# Patient Record
Sex: Female | Born: 1965 | Race: White | Hispanic: No | Marital: Married | Smoking: Former smoker
Health system: Southern US, Community
[De-identification: ages and names within clinical notes are randomized; demographics above are authoritative.]

## PROBLEM LIST (undated history)

## (undated) DIAGNOSIS — I10 Essential (primary) hypertension: Secondary | ICD-10-CM

## (undated) DIAGNOSIS — C023 Malignant neoplasm of anterior two-thirds of tongue, part unspecified: Principal | ICD-10-CM

## (undated) DIAGNOSIS — G35 Multiple sclerosis: Secondary | ICD-10-CM

## (undated) DIAGNOSIS — J302 Other seasonal allergic rhinitis: Secondary | ICD-10-CM

## (undated) DIAGNOSIS — Z923 Personal history of irradiation: Secondary | ICD-10-CM

## (undated) HISTORY — PX: ABDOMINAL HYSTERECTOMY: SHX81

## (undated) HISTORY — PX: WISDOM TOOTH EXTRACTION: SHX21

## (undated) HISTORY — PX: TUBAL LIGATION: SHX77

## (undated) HISTORY — PX: KNEE SURGERY: SHX244

## (undated) HISTORY — DX: Malignant neoplasm of anterior two-thirds of tongue, part unspecified: C02.3

---

## 2015-10-28 HISTORY — PX: PARTIAL GLOSSECTOMY: SHX2173

## 2016-12-18 ENCOUNTER — Encounter: Payer: Self-pay | Admitting: Radiation Oncology

## 2016-12-19 ENCOUNTER — Telehealth: Payer: Self-pay | Admitting: *Deleted

## 2016-12-19 NOTE — Telephone Encounter (Signed)
Oncology Nurse Navigator Documentation  Placed introductory call to new referral patient Sabrina Romero.  Introduced myself as the H&N oncology nurse navigator that works with Dr. Isidore Moos to whom she has self-referred and with whom she has 2/6 0930/1000.  She has received dx of tongue/LN carcinoma from ENT Breck Coons and Radiation Oncologist Lilli Few has prescribed RT.   She has postponed RT start bco move to [place].   She and her husband are moving from [place] to [facility] Wednesday 1/31, expecting to initially stay in Extended Stay hotel.  They have arranged to meet with a realtor to look for house.  I suggested she have realtor show her location of [facility] for reference.  I  provided my contact information, encouraged her to call me after she gets to [place] so I can provide further information/guidance re her 2/6 appt.    She verbalized understanding of information provided, expressed appreciation for my call.  [name], RN, BSN, [place] [facility]ck Oncology Nurse [place] at [place] [phone]

## 2016-12-21 ENCOUNTER — Telehealth: Payer: Self-pay | Admitting: *Deleted

## 2016-12-21 NOTE — Telephone Encounter (Signed)
Oncology Nurse Navigator Documentation  Spoke with Sabrina Romero:  Informed her appt with Dr. Isidore Moos has been move to 9:00 on 2/6 with nurse eval at 8:30.  She indicated wisdom teeth have been extracted, overall dental health is good.  I explained the purpose of a dental evaluation by Dr. Enrique Sack prior to starting RT, indicated she wd be contacted by his office to arrange an appt within a day or so of her appt with Dr. Isidore Moos.  She voiced understanding of information provided.  [name], RN, BSN, [place] Neck Oncology Nurse [facility] at [place] [phone]

## 2016-12-23 ENCOUNTER — Telehealth: Payer: Self-pay | Admitting: Hematology and Oncology

## 2016-12-23 NOTE — Telephone Encounter (Signed)
Lft the pt a vm to let her know that she has an appt w/Dr. Alvy Bimler on the same day as her Radonc appt at 12pm.

## 2016-12-23 NOTE — Progress Notes (Signed)
Head and Neck Cancer Location of Tumor / Histology:  12/17/16 Left Submandibular lymph node biopsy: Positive for metastatic squamous cell carcarcinoma  10/28/2015 Tongue, Left, partial Glossectomy: -- Squamous cell carcinoma   Patient presented on 12/09/16 to her doctor in [place] with symptoms of: Left Ear pain for the past couple of months. She also reported experiencing increased discomfort along the Left tongue  Biopsies of  revealed:  10/28/2015 Tongue, Left, partial Glossectomy: -- Squamous cell carcinoma  12/17/16 Left Submandibular lymph node biopsy: Positive for metastatic squamous cell carcarcinoma    Nutrition Status Yes No Comments  Weight changes? [x]  []  She reports losing 35 lbs over the past year and a half  Swallowing concerns? [x]  []  She is eating softer foods. She also reports being unable to eat spicy food due to pain in her tongue.   PEG? []  [x]     Referrals Yes No Comments  Social Work? []  [x]    Dentistry? [x]  []  Dr. Enrique Sack 12/30/16  Swallowing therapy? []  [x]    Nutrition? []  [x]    Med/Onc? [x]  []  Dr. Alvy Bimler today at 12:00   Safety Issues Yes No Comments  Prior radiation? []  [x]    Pacemaker/ICD? []  [x]    Possible current pregnancy? []  [x]    Is the patient on methotrexate? []  [x]     Tobacco/Marijuana/Snuff/ETOH use: Former smoker, no smokeless tobacco history.   Past/Anticipated interventions by otolaryngology, if any:  She is S/P Left oral tongue partial glossectomy on 10/28/15  Past/Anticipated interventions by medical oncology, if any:  Dr. Alvy Bimler today at 12:00     Current Complaints / other details:    ENT Breck Coons and Radiation Oncologist Lilli Few in [place]. She moved to [place] on 12/23/16.  She was seen in consultation on 12/04/15 by Dr. Lilli Few (Radiation Oncologist). She was advised to pursue Radiation for her Stage  T2 Nx Mx, Left Tongue squamous cell carcinoma which was diagnosed on 09/23/15. She underwent treatment planning  on 12/19/15 but when she presented for her first treatment she elected to not pursue treatment and decided to chose the surveillance route against medical advice.   PET 12/07/16 showed increased activity along the left lateral tongue as well as a new left submandibular node.   BP 139/88   Pulse 86   Temp 98.1 F (36.7 C)   Ht 5\' 4"  (1.626 m)   Wt 184 lb 3.2 oz (83.6 kg)   SpO2 100% Comment: room air  BMI 31.62 kg/m    Wt Readings from Last 3 Encounters:  12/29/16 184 lb 3.2 oz (83.6 kg)

## 2016-12-25 ENCOUNTER — Encounter: Payer: Self-pay | Admitting: Radiation Oncology

## 2016-12-25 NOTE — Progress Notes (Addendum)
Radiation Oncology         [phone] ________________________________  Initial Outpatient Consultation  Name: Sabrina Romero MRN: [id number]  Date: 12/29/2016  DOB: 1966/09/06  CC:Pcp Not In System  No ref. 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 Squamous cell cancer of tongue (HCC) 141.9 C02.9   2. Cancer of anterior two-thirds of tongue (HCC) 141.4 C02.3     CHIEF COMPLAINT: Here to discuss management of Recurrent Oral Tongue cancer  HISTORY OF PRESENT ILLNESS::Sabrina Romero is a 51 y.o. female who is self referred to our clinic following recurrence of tongue cancer.  She was a patient of ENT Dr. Breck Coons in [place], where the patient previously lived. Her cancer was first diagnosed on 09-23-15 and she opted for surgery alone.  Only the tongue tumor was resected - no nodes removed.  Her tumor was 2.5cm, with negative margins by 0.74m, moderately to poorly differentiated squamous cell carcinoma, with +PNI and +LVSI.  Partial glossectomy was performed on 10-28-15.   At the time, she met with Oncologist Dr. KLilli Fewwho recommended radiation therapy to treat the patient's disease.  This was ultimately not performed due to refusal by the patient.   She reports that she underwent PET imaging q 90 days for surveillance.  In Oct 2017 there was concern on MRI and PET of possible tongue recurrence but ENT did not see clinical evidence to warrant a biopsy.  Then[place] revealed a left submandibular node  that was metabolically active. Biopsy of this revealed metastatic squamous cell carcinoma.  Also there was activity in the floor of mouth and the left tongue concerning for active disease.   Patient reports progressive stinging of the left tongue and ear, and tenderness in the left submandibular area.  She has MS - dx in  1985 - which affects ability to urinate (though she doesn't need to self-cath) and relates to LE weakness  R worse than L.  Occasional left temple pain.  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    She's lost 35 pounds since partial glossectomy and it has affected her ability to eat.  She quit smoking "long ago" - denies chewing - reports 1-2 ETOH drinks per year.    She reports recent congestion in her upper airways, allergy-like symptoms.  Also, LE edema related to not having her recliner to sit in.  She is in a hotel until she and her husband (who is here today) [place].  She ambulates with walker for short distances; otherwise uses a wheelchair.   PREVIOUS RADIATION THERAPY: No  PAST MEDICAL HISTORY:  has a past medical history of Hypertension; Multiple sclerosis ([place]; Seasonal allergies; and Squamous cell cancer of tongue ([facility] (10/08/2015).    PAST SURGICAL HISTORY: Past Surgical History:  Procedure Laterality Date  . ABDOMINAL HYSTERECTOMY    . KNEE SURGERY Bilateral   . PARTIAL GLOSSECTOMY Left 10/28/2015   Left oral Tongue partial glossectomy.   . TUBAL LIGATION    . WISDOM TOOTH EXTRACTION      FAMILY HISTORY: family history includes Diabetes in her father; Heart disease in her mother.  SOCIAL HISTORY:  reports that she quit smoking about 20 years ago. She has never used smokeless tobacco. She reports that she drinks alcohol. She  reports that she does not use drugs. The patient and her husband have recently moved to [place] from [place] and are in the process of buying a house. They are currently staying in an Extended Stay hotel.  ALLERGIES: Sulfa antibiotics  MEDICATIONS:  Current Outpatient Prescriptions  Medication Sig Dispense Refill  . baclofen (LIORESAL) 10 MG tablet TAKE UP TO 4 TABLETS DAILY, IN DIVIDED DOSES.  99  . Cholecalciferol (VITAMIN D3) 2000 units capsule Take by mouth    . Glatiramer Acetate (COPAXONE) 40 MG/ML SOSY     .  hydrocodone-ibuprofen (VICOPROFEN) 5-200 MG tablet Take 1 tablet by mouth every 4 (four) hours as needed for pain.    Marland Kitchen lisinopril (PRINIVIL,ZESTRIL) 20 MG tablet Take 20 mg by mouth daily.  0  . Multiple Vitamin (MULTI-VITAMINS) TABS Take by mouth    . Omega-3 Fatty Acids (FISH OIL PO) Take by mouth    . naproxen (NAPROSYN) 500 MG tablet   0  . ondansetron (ZOFRAN) 8 MG tablet Take 1 tablet (8 mg total) by mouth every 8 (eight) hours as needed for nausea. for nausea 60 tablet 0  . oxyCODONE 10 MG TABS Take 1 tablet (10 mg total) by mouth every 6 (six) hours as needed for severe pain. 60 tablet 0   No current facility-administered medications for this encounter.     REVIEW OF SYSTEMS:  A 10+ POINT REVIEW OF SYSTEMS WAS OBTAINED including neurology, dermatology, psychiatry, cardiac, respiratory, lymph, extremities, GI, GU, Musculoskeletal, constitutional, breasts, reproductive, HEENT.  All pertinent positives are noted in the HPI.  All others are negative.   PHYSICAL EXAM:  height is _0  (1.626 m) and weight is 184 lb 3.2 oz (83.6 kg). Her temperature is 98.1 F (36.7 C). Her blood pressure is 139/88 and her pulse is 86. Her oxygen saturation is 100%.   General: Alert and oriented, in no acute distressm in wheelcair HEENT: Head is normocephalic. Extraocular movements are intact. Oropharynx is notable for no lesions. Oral cavity: oral tongue notable along L lateral side for bulging tissue that is possibly tumor recurrence vs surgical effects. No obvious tumor. No thrush. Neck: Neck is notable for palpable level level IIa mass, about 1.5-2cm Heart: Regular in rate and rhythm with no murmurs, rubs, or gallops. Chest: Clear to auscultation bilaterally, with no rhonchi, wheezes, or rales. Abdomen: Soft, nontender, nondistended, with no rigidity or guarding. Extremities: pitting LE edema. Lymphatics: see Neck Exam Skin: No concerning lesions. Musculoskeletal: symmetric strength and muscle tone  throughout UEs; Weakness in LEs, worse on right Neurologic:  Speech is fluent.  Provides a good history.  Alert, oriented. No gross CN deficits Psychiatric: Judgment and insight are intact. Affect is appropriate.  ECOG = 3  0 - Asymptomatic (Fully active, able to carry on all predisease activities without restriction)  1 - Symptomatic but completely ambulatory (Restricted in physically strenuous activity but ambulatory and able to carry out work of a light or sedentary nature. For example, light housework, office work)  2 - Symptomatic, <50% in bed during the day (Ambulatory and capable of all self care but unable to carry out any work activities. Up and about more than 50% of waking hours)  3 - Symptomatic, >50% in bed, but not bedbound (Capable of only limited self-care, confined to bed or chair 50% or more of waking hours)  4 - Bedbound (Completely disabled. Cannot carry on any self-care. Totally confined to bed or chair)  5 - Death   Eustace Pen MM,  Creech RH, Tormey DC, et al. [phone]). "Toxicity and response criteria of the [facility]". [place] Oncol. 5 (6): 649-55  LABORATORY DATA:    :       RADIOGRAPHY: AS ABOVE    IMPRESSION/PLAN:  This is a delightful patient with recurrent head and neck cancer - oral tongue.  She has disease in the nodal tissue and possibly the oral cavity. I would recommend consultation with Dr. Constance Holster of ENT for resection, first; then, 6-7 weeks of adjuvant radiotherapy for this patient, +/- chemotherapy. We discussed goals of care. She wants to achieve cure.  We discussed the potential risks, benefits, and side effects of radiotherapy. We talked in detail about acute and late effects. We discussed that some of the most bothersome acute effects may be mucositis, dysgeusia, salivary changes, skin irritation, hair loss, dehydration, weight loss and fatigue. We talked about late effects which include but are not necessarily limited to  dysphagia, hypothyroidism, nerve injury, spinal cord injury, xerostomia, trismus, and neck edema. No guarantees of treatment were given. A consent form was signed and placed in the patient's medical record. The patient is enthusiastic about proceeding with treatment. I look forward to participating in the patient's care.    CT Simulation (treatment planning) will take place after clearance from dentist and ENT  We also discussed that the treatment of head and neck cancer is a multidisciplinary process to maximize treatment outcomes and quality of life. For this reasons the following referrals have been or will be made:   Medical oncology to discuss chemotherapy. The patient is scheduled to consult with Dr. Alvy Bimler today at noon.   Dentistry for dental evaluation, possible extractions in the radiation fields, and /or advice on reducing risk of cavities, osteoradionecrosis, or other oral issues. The patient is scheduled to consult with Dr. Enrique Sack  prior to beginning radiation therapy to ensure good dental health.   Nutritionist for nutrition support during and after treatment.   Speech language pathology for swallowing and/or speech therapy.   Social work for social support.    Physical therapy due to risk of lymphedema in neck and deconditioning and MS issues.   Baseline labs including TSH. __________________________________________   Eppie Gibson, MD  This document serves as a record of services personally performed by Eppie Gibson, MD. It was created on her behalf by Maryla Morrow, a trained medical scribe. The creation of this record is based on the scribe's personal observations and the provider's statements to them. This document has been checked and approved by the attending provider.

## 2016-12-28 ENCOUNTER — Telehealth: Payer: Self-pay | Admitting: *Deleted

## 2016-12-28 ENCOUNTER — Other Ambulatory Visit: Payer: Self-pay | Admitting: Radiation Oncology

## 2016-12-28 ENCOUNTER — Encounter: Payer: Self-pay | Admitting: Radiation Oncology

## 2016-12-28 ENCOUNTER — Inpatient Hospital Stay
Admission: RE | Admit: 2016-12-28 | Discharge: 2016-12-28 | Disposition: A | Discharge status: Home / Self Care | Payer: Self-pay | Source: Ambulatory Visit | Attending: Radiation Oncology | Admitting: Radiation Oncology

## 2016-12-28 DIAGNOSIS — C801 Malignant (primary) neoplasm, unspecified: Secondary | ICD-10-CM

## 2016-12-28 NOTE — Telephone Encounter (Signed)
Oncology Nurse Navigator Documentation  Spoke with Ms. Noon, confirmed tomorrow's appts with Drs. Isidore Moos and [place], provided directions to [facility], explained arrival and registration procedures.  Also, confirmed understanding of Wed morning's appt with Dr. Enrique Sack. She understands I will be joining her during tomorrow's appts.  [name], RN, BSN, [place] Neck Oncology Nurse [place] at [place] [phone]

## 2016-12-28 NOTE — Telephone Encounter (Signed)
Oncology Nurse Navigator Documentation  Ms. Board called, I again provided appt information for tomorrow and Wednesday.  [name], RN, BSN, [place] Neck Oncology Nurse [place] at [place] [phone]

## 2016-12-29 ENCOUNTER — Encounter: Payer: Self-pay | Admitting: *Deleted

## 2016-12-29 ENCOUNTER — Institutional Professional Consult (permissible substitution): Payer: Medicare Other | Admitting: Radiation Oncology

## 2016-12-29 ENCOUNTER — Encounter: Payer: Self-pay | Admitting: Radiation Oncology

## 2016-12-29 ENCOUNTER — Ambulatory Visit
Admission: RE | Admit: 2016-12-29 | Discharge: 2016-12-29 | Disposition: A | Discharge status: Home / Self Care | Payer: Medicare Other | Source: Ambulatory Visit | Attending: Radiation Oncology | Admitting: Radiation Oncology

## 2016-12-29 ENCOUNTER — Encounter: Payer: Self-pay | Admitting: Hematology and Oncology

## 2016-12-29 ENCOUNTER — Telehealth: Payer: Self-pay | Admitting: *Deleted

## 2016-12-29 ENCOUNTER — Ambulatory Visit (HOSPITAL_BASED_OUTPATIENT_CLINIC_OR_DEPARTMENT_OTHER): Payer: Medicare Other | Admitting: Hematology and Oncology

## 2016-12-29 VITALS — BP 139/88 | HR 86 | Temp 98.1°F | Ht 64.0 in | Wt 184.2 lb

## 2016-12-29 DIAGNOSIS — Z9889 Other specified postprocedural states: Secondary | ICD-10-CM | POA: Diagnosis not present

## 2016-12-29 DIAGNOSIS — C029 Malignant neoplasm of tongue, unspecified: Secondary | ICD-10-CM

## 2016-12-29 DIAGNOSIS — I10 Essential (primary) hypertension: Secondary | ICD-10-CM

## 2016-12-29 DIAGNOSIS — Z888 Allergy status to other drugs, medicaments and biological substances status: Secondary | ICD-10-CM | POA: Diagnosis not present

## 2016-12-29 DIAGNOSIS — Z87891 Personal history of nicotine dependence: Secondary | ICD-10-CM | POA: Insufficient documentation

## 2016-12-29 DIAGNOSIS — G35D Multiple sclerosis, unspecified: Secondary | ICD-10-CM

## 2016-12-29 DIAGNOSIS — Z510 Encounter for antineoplastic radiation therapy: Secondary | ICD-10-CM | POA: Diagnosis not present

## 2016-12-29 DIAGNOSIS — C023 Malignant neoplasm of anterior two-thirds of tongue, part unspecified: Secondary | ICD-10-CM | POA: Insufficient documentation

## 2016-12-29 DIAGNOSIS — Z90710 Acquired absence of both cervix and uterus: Secondary | ICD-10-CM | POA: Diagnosis not present

## 2016-12-29 DIAGNOSIS — G35 Multiple sclerosis: Secondary | ICD-10-CM | POA: Diagnosis not present

## 2016-12-29 DIAGNOSIS — Z79899 Other long term (current) drug therapy: Secondary | ICD-10-CM | POA: Insufficient documentation

## 2016-12-29 DIAGNOSIS — G893 Neoplasm related pain (acute) (chronic): Secondary | ICD-10-CM | POA: Diagnosis not present

## 2016-12-29 DIAGNOSIS — R634 Abnormal weight loss: Secondary | ICD-10-CM

## 2016-12-29 HISTORY — DX: Malignant neoplasm of anterior two-thirds of tongue, part unspecified: C02.3

## 2016-12-29 HISTORY — DX: Essential (primary) hypertension: I10

## 2016-12-29 HISTORY — DX: Other seasonal allergic rhinitis: J30.2

## 2016-12-29 HISTORY — DX: Multiple sclerosis: G35

## 2016-12-29 MED ORDER — OXYCODONE HCL 10 MG PO TABS: 10.0000 mg | ORAL_TABLET | Freq: Four times a day (QID) | ORAL | 0 refills | Status: DC | PRN

## 2016-12-29 MED ORDER — ONDANSETRON HCL 8 MG PO TABS: 8.0000 mg | ORAL_TABLET | Freq: Three times a day (TID) | ORAL | 0 refills | Status: DC | PRN

## 2016-12-29 NOTE — Progress Notes (Signed)
Oncology Nurse Navigator Documentation  1. Met with Sabrina Romero and her husband during initial consult with Dr. Gorsuch.  Showed them the location of Dr. Kulinski's office and [facility] as reference for future appts, including arrival procedure for these appts.   2. She shared:  Has an appt with ENT Dr. Rosen next Tuesday, result of self-referral.  I later informed Dr. Squire.  She has not yet established with a neurologist for her MS, they plan to search out recommendation on MS Society website per guidance from her neurologist in [place]. 3. They understand her case will be discussed at tomorrow morning's H&N Conference. 4. I showed them location of Dental Medicine as reference for tomorrow's appt. I encouraged them to call with questions/concerns, they verbalized understanding.  Rick Diehl, RN, BSN, CHPN Head & Neck Oncology Nurse Navigator [facility] at [place] [phone]  

## 2016-12-29 NOTE — Progress Notes (Signed)
Oncology Nurse Navigator Documentation  1. Met with Sabrina Romero during initial consult with Dr. Isidore Moos.  She was accompanied by her husband.  She arrived in own motorized WC r/t MS.  She noted they have moved to [employer] job transfer, they are closing on new home on 3/16. 2. Further introduced myself as their Navigator, explained my role as a member of the Care Team.   3. Provided New Patient Information packet, discussed contents:  Contact information for physician(s), myself, other members of the Care Team.  Advance Directive information ([place] blue pamphlet with LCSW contact info)  Fall Prevention Patient [place] sheet  [place] campus map with highlight of [place] 4. Provided introductory explanation of radiation treatment including SIM planning and purpose of Aquaplast head and shoulder mask, showed them example.  She voiced familiarity as she had received pre-radiation education in [place], [place]. 5. Provided a tour of SIM and [place] areas, explained treatment and arrival procedures. 6. I escorted them to lobby to register for appt with Dr. Alvy Bimler.    [name], RN, BSN, [place] Neck Oncology Nurse [place] at [place] [phone]

## 2016-12-29 NOTE — Telephone Encounter (Signed)
Oncology Nurse Navigator Documentation  Spoke with Sabrina Romero, asked her to call her radiologist ([place]) and request next-day delivery of discs with recent PET and CT scans.  She later called to confirm request made, they were going to call for shipping information.  I later confirmed with RadOnc Suanne Marker she received call from [facility], discs being shipped [employer] for receipt tomorrow.   [name], RN, BSN, [place] Neck Oncology Nurse [place] at [place] [phone]

## 2016-12-30 ENCOUNTER — Encounter (HOSPITAL_COMMUNITY): Payer: Self-pay | Admitting: Dentistry

## 2016-12-30 ENCOUNTER — Ambulatory Visit (HOSPITAL_COMMUNITY): Payer: Self-pay | Admitting: Dentistry

## 2016-12-30 ENCOUNTER — Telehealth: Payer: Self-pay | Admitting: *Deleted

## 2016-12-30 VITALS — BP 126/78 | HR 89 | Temp 98.3°F

## 2016-12-30 DIAGNOSIS — K030 Excessive attrition of teeth: Secondary | ICD-10-CM | POA: Diagnosis not present

## 2016-12-30 DIAGNOSIS — C023 Malignant neoplasm of anterior two-thirds of tongue, part unspecified: Secondary | ICD-10-CM

## 2016-12-30 DIAGNOSIS — K036 Deposits [accretions] on teeth: Secondary | ICD-10-CM

## 2016-12-30 DIAGNOSIS — K08409 Partial loss of teeth, unspecified cause, unspecified class: Secondary | ICD-10-CM

## 2016-12-30 DIAGNOSIS — K0530 Chronic periodontitis, unspecified: Secondary | ICD-10-CM

## 2016-12-30 DIAGNOSIS — K031 Abrasion of teeth: Secondary | ICD-10-CM

## 2016-12-30 DIAGNOSIS — Z01818 Encounter for other preprocedural examination: Secondary | ICD-10-CM

## 2016-12-30 DIAGNOSIS — K06010 Localized gingival recession, unspecified: Secondary | ICD-10-CM

## 2016-12-30 DIAGNOSIS — J392 Other diseases of pharynx: Secondary | ICD-10-CM

## 2016-12-30 MED ORDER — SODIUM FLUORIDE 1.1 % DT GEL: DENTAL | 99 refills | Status: AC

## 2016-12-30 NOTE — Patient Instructions (Signed)

## 2016-12-30 NOTE — Progress Notes (Signed)
DENTAL CONSULTATION  Date of Consultation:  12/30/2016 Patient Name:   Sabrina Romero Date of Birth:   1966-09-29 Medical Record Number: [id number]  VITALS: BP 126/78 (BP Location: Left Arm)   Pulse 89   Temp 98.3 F (36.8 C) (Oral)    CHIEF COMPLAINT: The patient was referred by Dr. Isidore Moos for a dental consultation.   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  Patient underwent a left partial glossectomy on 10/28/2015 but did not have postoperative radiation therapy. Patient subsequently developed left neck mass that was found to be recurrent squamous cell carcinoma. Patient also with possible left lateral tongue and floor of mouth involvement. Patient with anticipated surgical resection followed by postoperative radiation therapy with possible chemotherapy if needed. Patient is now seen as part of a pre-chemoradiation therapy dental protocol examination.  The patient currently denies acute toothaches, swellings, or abscesses. Patient was last seen by a dentist several weeks ago for dental cleaning. This was with her primary dentist in [place].  The patient is usually seen on every 6 month basis. Patient denies having any unmet dental needs. Patient denies having dental phobia.  PROBLEM LIST: Patient Active Problem List   Diagnosis Date Noted  . Cancer of anterior two-thirds of tongue ([place]) 12/29/2016    Priority: High    PMH: Past Medical History:  Diagnosis Date  . Hypertension   . Multiple sclerosis ([place])   . Seasonal allergies   . Squamous cell cancer of tongue ([place]) 10/08/2015    PSH: Past Surgical History:  Procedure Laterality Date  . ABDOMINAL HYSTERECTOMY    . KNEE SURGERY Bilateral   . PARTIAL GLOSSECTOMY Left 10/28/2015   Left oral Tongue partial glossectomy.   . TUBAL LIGATION    . WISDOM TOOTH EXTRACTION      ALLERGIES: Allergies  Allergen  Reactions  . Sulfa Antibiotics Hives    MEDICATIONS: Current Outpatient Prescriptions  Medication Sig Dispense Refill  . baclofen (LIORESAL) 10 MG tablet TAKE UP TO 4 TABLETS DAILY, IN DIVIDED DOSES.  99  . Cholecalciferol (VITAMIN D3) 2000 units capsule Take by mouth    . Glatiramer Acetate (COPAXONE) 40 MG/ML SOSY     . hydrocodone-ibuprofen (VICOPROFEN) 5-200 MG tablet Take 1 tablet by mouth every 4 (four) hours as needed for pain.    Marland Kitchen lisinopril (PRINIVIL,ZESTRIL) 20 MG tablet Take 20 mg by mouth daily.  0  . Multiple Vitamin (MULTI-VITAMINS) TABS Take by mouth    . naproxen (NAPROSYN) 500 MG tablet   0  . Omega-3 Fatty Acids (FISH OIL PO) Take by mouth    . ondansetron (ZOFRAN) 8 MG tablet Take 1 tablet (8 mg total) by mouth every 8 (eight) hours as needed for nausea. for nausea 60 tablet 0  . oxyCODONE 10 MG TABS Take 1 tablet (10 mg total) by mouth every 6 (six) hours as needed for severe pain. 60 tablet 0   No current facility-administered medications for this visit.     LABS: No results found for: WBC, HGB, HCT, MCV, PLT No results found for: NA, K, CL, CO2, GLUCOSE, BUN, CREATININE, CALCIUM, GFRNONAA, GFRAA No results found for: INR, PROTIME No results found for: PTT  SOCIAL HISTORY: Social History   Social History  . Marital status: Married    Spouse name: Romie Minus  . Number of children: 0  . Years of education: N/A   Occupational History  .  unemployed    Social History Main Topics  . Smoking status: Former Smoker    Quit date: 12/29/1996  . Smokeless tobacco: Never Used     Comment: one pack a week for about 4 years.   . Alcohol use Yes     Comment: rare (twice a year)  . Drug use: No  . Sexual activity: Not on file   Other Topics Concern  . Not on file   Social History Narrative  . No narrative on file    FAMILY HISTORY: Family History  Problem Relation Age of Onset  . Diabetes Father   . Heart disease Mother     REVIEW OF SYSTEMS: Reviewed With  the patient as per history of present illness. Psych: Patient denies having dental phobia.  DENTAL HISTORY: CHIEF COMPLAINT: The patient was referred by Dr. Isidore Moos for a dental consultation.   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  Patient underwent a left partial glossectomy on 10/28/2015 but did not have postoperative radiation therapy. Patient subsequently developed left neck mass that was found to be recurrent squamous cell carcinoma. Patient also with possible left lateral tongue and floor of mouth involvement. Patient with anticipated surgical resection followed by postoperative radiation therapy with possible chemotherapy if needed. Patient is now seen as part of a pre-chemoradiation therapy dental protocol examination.  The patient currently denies acute toothaches, swellings, or abscesses. Patient was last seen by a dentist several weeks ago for dental cleaning. This was with her primary dentist in [place].  The patient is usually seen on every 6 month basis. Patient denies having any unmet dental needs. Patient denies having dental phobia.   DENTAL EXAMINATION: GENERAL: Patient is a well-developed, well-nourished female in no acute distress that presents in a motorized wheelchair. HEAD AND NECK: Patient has left neck lymphadenopathy. No obvious right neck lymphadenopathy. She denies acute TMJ symptoms. Patient has a maximum interincisal opening of 40 mm. INTRAORAL EXAM: Patient has normal saliva. There is no evidence of oral abscess formation. The left lateral posterior tongue is consistent with previous partial glossectomy. DENTITION: The patient is missing tooth numbers 1, 16, 17, and 32. There is evidence of maxillary and mandibular interincisal attrition. Multiple flexure lesions are noted. PERIODONTAL: The patient has chronic periodontitis with minimal plaque  accumulations, selective areas of gingival recession, and no significant tooth mobility. Patient has incipient to moderate bone loss noted. DENTAL CARIES/SUBOPTIMAL RESTORATIONS: No obvious dental caries noted. Restorations all appear to be acceptable. ENDODONTIC: Patient currently denies acute pulpitis symptoms. I do not see any evidence of periapical pathology or radiolucency. Patient has had previous root canal therapy associated with tooth #30 with no obvious persistent periapical pathology or symptoms. CROWN AND BRIDGE: The patient has multiple crown and bridge restorations that are acceptable. PROSTHODONTIC: No partial dentures. OCCLUSION: Patient has some lower anterior crowding but a stable occlusion.  RADIOGRAPHIC INTERPRETATION: An orthopantogram was taken and supplemented with 4 bitewings. There are missing tooth numbers 1, 16, 17, and 32. There is incipient to moderate bone loss noted. There is no evidence of periapical pathology or radiolucency. Tooth #30 as had a previous root canal therapy with no obvious persistent periapical patholgy. There is evidence of maxillary and mandibular anterior incisal attrition.  ASSESSMENTS: 1. Squamous cell carcinoma of the left lateral tongue- status post left partial glossectomy.  2. Pre-chemoradiation therapy dental protocol examination 3. Missing tooth numbers  1, 16, 17, and 32. 4. Chronic periodontitis with bone loss 5. Accretions-minimal 6. Selective areas of gingival recession 7. Maxillary and mandibular incisal attrition 8. Multiple flexure lesions are noted 9. Lower anterior crowding but a stable occlusion   PLAN/RECOMMENDATIONS: 1. I discussed the risks, benefits, and complications of various treatment options with the patient in relationship to her medical and dental conditions, anticipated chemoradiation therapy and chemoradiation therapy side effects to include xerostomia, radiation caries, trismus, mucositis, taste changes, gum and  jawbone changes, and risk for infection and osteoradionecrosis. We discussed various treatment options to include no treatment, multiple extraction teeth in the primary field radiation therapy with alveoloplasty, pre-prosthetic surgery as indicated, periodontal therapy, dental restorations, root canal therapy, crown and bridge therapy, implant therapy, and replacement of missing teeth as indicated. We also discussed fabrication of a tongue positioner, scatter protection devices, and fluoride trays. The patient currently wishes to proceed with impressions today for the fabrication of fluoride trays and start of the fabrication of the tongue positioner/radiation cone locator.  The patient will then return for continued fabrication of the tongue positioner prior to anticipated surgery with Dr. Constance Holster. Fluoride trays will be inserted once it is determined if teeth will need to be extracted in conjunction with the surgery with Dr. Constance Holster. A prescription for FluoriSHIELD was sent to [facility] long outpatient pharmacy with refills for one year.  2. Discussion of findings with medical team and coordination of future medical and dental care as needed.  I spent in excess of  120 minutes during the conduct of this consultation and >50% of this time involved direct face-to-face encounter for counseling and/or coordination of the patient's care.    Lenn Cal, DDS

## 2016-12-30 NOTE — Addendum Note (Signed)
Encounter addended by: Eppie Gibson, MD on: 12/30/2016  1:13 PM<BR>    Actions taken: Sign clinical note

## 2016-12-30 NOTE — Telephone Encounter (Signed)
Oncology Nurse Navigator Documentation  Called Sabrina Romero, informed her consensus at H&N Conference this morning for her tmt plan is surgery followed by radiation therapy.   She understands Dr. Constance Holster will discuss further when she sees him next Tuesday morning.  She thanked me for the update.  [name], RN, BSN, [place] Oncology Nurse [place] at [place] [phone]

## 2016-12-31 ENCOUNTER — Ambulatory Visit
Admission: RE | Admit: 2016-12-31 | Discharge: 2016-12-31 | Disposition: A | Discharge status: Home / Self Care | Payer: Medicare Other | Source: Ambulatory Visit | Attending: Radiation Oncology | Admitting: Radiation Oncology

## 2016-12-31 ENCOUNTER — Ambulatory Visit
Admission: RE | Admit: 2016-12-31 | Discharge: 2016-12-31 | Disposition: A | Discharge status: Home / Self Care | Payer: Self-pay | Source: Ambulatory Visit | Attending: Radiation Oncology | Admitting: Radiation Oncology

## 2016-12-31 ENCOUNTER — Other Ambulatory Visit: Payer: Self-pay | Admitting: Radiation Oncology

## 2016-12-31 DIAGNOSIS — C801 Malignant (primary) neoplasm, unspecified: Secondary | ICD-10-CM

## 2016-12-31 DIAGNOSIS — I10 Essential (primary) hypertension: Secondary | ICD-10-CM | POA: Insufficient documentation

## 2016-12-31 DIAGNOSIS — G893 Neoplasm related pain (acute) (chronic): Secondary | ICD-10-CM | POA: Insufficient documentation

## 2016-12-31 DIAGNOSIS — G35 Multiple sclerosis: Secondary | ICD-10-CM | POA: Insufficient documentation

## 2016-12-31 NOTE — Assessment & Plan Note (Signed)
She will continue follow-up with neurologist.

## 2016-12-31 NOTE — Progress Notes (Signed)
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 female is here because of newly diagnosed recurrent tongue cancer. She is accompanied by her husband. She recently relocated to [place] I review over 100 pages of outside records, collaborated the history with the patient and summarized as follows: Oncology History   Outside PET scan showed no evidence of disease. She doesn't some abnormal uptake on the left side of the tongue.     Cancer of anterior two-thirds of tongue ([place])   09/23/2015 Initial Diagnosis    Initial diagnosis in [place]. 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 She had biopsy which show moderately differentiated squamous cell carcinoma, at least 2.5 mm depth.      10/15/2015 Imaging    Outside CT scan showed no evidence of other disease      10/28/2015 Surgery    She had left oral tongue partial glossectomy which revealed 2.5 cm squamous cell carcinoma, highly infiltrated and ulcerated, moderate to poorly differentiated with lymphovascular invasion and perineural invasion. Margins are negative. The patient refused adjuvant treatment and wanted only surveillance follow-up imaging      10/28/2015 Pathology Results    Surgical pathology revealed squamous cell carcinoma, [phone] cm, 1.1 cm thick, ulcerated, evidence of lymphovascular invasion and perineural invasion, moderate to poorly differentiated, margins were close at 0.6 mm posteriorly      12/10/2015 PET scan    Outside PET scan showed no evidence of disease but there are abnormal uptake      03/27/2016 PET scan    Outside PET/CT scan showed diffuse physiologic activity in the oral cavity and oropharynx. No evidence of distant metastatic disease.      07/07/2016 PET scan    Outside  PET/CT scan show possibility of presence of recurrent tumor.      07/16/2016 Imaging    She had outside MRI of the neck which show abnormal enhancement within the left inferior lateral tongue, extending to partially involve the surrounding tissue.      08/21/2016 PET scan    Outside PET/CT scan show activity along the left lateral tongue and adjacent soft tissue      11/30/2016 Miscellaneous    She has symptoms of pathology and discomfort near the TMJ and along the left side of her tongue.      12/07/2016 PET scan    Outside PET/CT scan show focal uptake in the left lower mild/inferior aspect of the left tongue and lymphadenopathy in the anterior portion of the left submandibular gland involving an abnormal lymph node.      12/17/2016 Procedure    She underwent ultrasound-guided left submandibular lymph node biopsy      12/17/2016 Pathology Results    Biopsy confirmed metastatic squamous cell carcinoma      Since her disease relapse, she has been complaining of mild difficulties with chewing. She denies recent weight loss. She has complained of left ear/jaw pain and she was taking some old prescription that was given to her 2 years ago. She is requesting medication refills. She denies nausea. 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 According to the patient, the reason she did not pursue adjuvant treatment was because of spiritual decision.  MEDICAL HISTORY:  Past Medical History:  Diagnosis Date  . Hypertension   .  Multiple sclerosis ([place])   . Seasonal allergies   . Squamous cell cancer of tongue ([place]) 10/08/2015    SURGICAL HISTORY: Past Surgical History:  Procedure Laterality Date  . ABDOMINAL HYSTERECTOMY    . KNEE SURGERY Bilateral   . PARTIAL GLOSSECTOMY Left 10/28/2015   Left oral Tongue partial glossectomy.   . TUBAL LIGATION    . WISDOM TOOTH EXTRACTION      SOCIAL HISTORY: Social History   Social History  . Marital status:  Married    Spouse name: Romie Minus  . Number of children: 0  . Years of education: N/A   Occupational History  . unemployed    Social History Main Topics  . Smoking status: Former Smoker    Quit date: 12/29/1996  . Smokeless tobacco: Never Used     Comment: one pack a week for about 4 years.   . Alcohol use Yes     Comment: rare (twice a year)  . Drug use: No  . Sexual activity: Not on file   Other Topics Concern  . Not on file   Social History Narrative  . No narrative on file    FAMILY HISTORY: Family History  Problem Relation Age of Onset  . Diabetes Father   . Heart disease Mother     ALLERGIES:  is allergic to sulfa antibiotics.  MEDICATIONS:  Current Outpatient Prescriptions  Medication Sig Dispense Refill  . baclofen (LIORESAL) 10 MG tablet TAKE UP TO 4 TABLETS DAILY, IN DIVIDED DOSES.  99  . Cholecalciferol (VITAMIN D3) 2000 units capsule Take by mouth    . hydrocodone-ibuprofen (VICOPROFEN) 5-200 MG tablet Take 1 tablet by mouth every 4 (four) hours as needed for pain.    Marland Kitchen lisinopril (PRINIVIL,ZESTRIL) 20 MG tablet Take 20 mg by mouth daily.  0  . Multiple Vitamin (MULTI-VITAMINS) TABS Take by mouth    . Omega-3 Fatty Acids (FISH OIL PO) Take by mouth    . ondansetron (ZOFRAN) 8 MG tablet Take 1 tablet (8 mg total) by mouth every 8 (eight) hours as needed for nausea. for nausea (Patient not taking: Reported on 12/30/2016) 60 tablet 0  . Glatiramer Acetate (COPAXONE) 40 MG/ML SOSY Inject 40 mg into the skin 3 (three) times a week.     . naproxen (NAPROSYN) 500 MG tablet   0  . oxyCODONE 10 MG TABS Take 1 tablet (10 mg total) by mouth every 6 (six) hours as needed for severe pain. 60 tablet 0  . sodium fluoride (FLUORISHIELD) 1.1 % GEL dental gel Instill one drop of gel per tooth space of fluoride tray. Place over teeth for 5 minutes. Remove. Spit out excess. Repeat nightly. 120 mL prn   No current facility-administered medications for this visit.     REVIEW OF  SYSTEMS:   Constitutional: Denies fevers, chills or abnormal night sweats Eyes: Denies blurriness of vision, double vision or watery eyes Ears, nose, mouth, throat, and face: Denies mucositis or sore throat Respiratory: Denies cough, dyspnea or wheezes Cardiovascular: Denies palpitation, chest discomfort or lower extremity swelling Gastrointestinal:  Denies nausea, heartburn or change in bowel habits Skin: Denies abnormal skin rashes Lymphatics: Denies new lymphadenopathy or easy bruising Behavioral/Psych: Mood is stable, no new changes  All other systems were reviewed with the patient and are negative.  PHYSICAL EXAMINATION: ECOG PERFORMANCE STATUS: 2 - Symptomatic, <50% confined to bed  Vitals:   12/29/16 1101  BP: 133/71  Pulse: 93  Resp: 18  Temp: 98.6 F (37 C)  Filed Weights   12/29/16 1101  Weight: 184 lb 4.8 oz (83.6 kg)    GENERAL:alert, no distress and comfortable. She is examined sitting on the wheelchair SKIN: skin color, texture, turgor are normal, no rashes or significant lesions EYES: normal, conjunctiva are pink and non-injected, sclera clear OROPHARYNX:no exudate, no erythema and lips, buccal mucosa, with evidence of irregularity at the border of the left lateral side of the tongue.  NECK: supple, thyroid normal size, non-tender, without nodularity LYMPH:  She has palpable lymphadenopathy at the submandibular region LUNGS: clear to auscultation and percussion with normal breathing effort HEART: regular rate & rhythm and no murmurs and no lower extremity edema ABDOMEN:abdomen soft, non-tender and normal bowel sounds Musculoskeletal:no cyanosis of digits and no clubbing  PSYCH: alert & oriented x 3 with fluent speech NEURO: no focal motor/sensory deficits  ASSESSMENT & PLAN:  Cancer of anterior two-thirds of tongue ([place]) I reviewed over 100 pages of outside records. Her case was reviewed at the recent ENT tumor board. The patient will be seeing ENT  soon. She could consider local resection followed by postoperative radiation treatment only. I do not see a role for chemotherapy at this point.   Hypertension Continue current medication. She needs to get established with local primary care doctor  Multiple sclerosis [facility]) She will continue follow-up with neurologist.  Cancer associated pain She is complaining of jaw pain at the site of disease. She requested the exact same prescription she is getting from her doctor in [place]. I told her the specific request cannot be accommodated. I gave her prescription pain medicine without ibuprofen but with allow her to take ibuprofen as needed in addition to the narcotic prescription. I clarify with the patient since I will not be following with her long-term, she would need to get her refill from another provider.  All questions were answered. The patient knows to call the clinic with any problems, questions or concerns. I spent 50 minutes counseling the patient face to face. The total time spent in the appointment was 95 minutes and more than 50% was on counseling.     Heath Lark, MD 12/31/16 5:21 PM

## 2016-12-31 NOTE — Assessment & Plan Note (Signed)
I reviewed over 100 pages of outside records. Her case was reviewed at the recent ENT tumor board. The patient will be seeing ENT soon. She could consider local resection followed by postoperative radiation treatment only. I do not see a role for chemotherapy at this point.

## 2016-12-31 NOTE — Assessment & Plan Note (Signed)
Continue current medication. She needs to get established with local primary care doctor

## 2016-12-31 NOTE — Assessment & Plan Note (Signed)
She is complaining of jaw pain at the site of disease. She requested the exact same prescription she is getting from her doctor in [place]. I told her the specific request cannot be accommodated. I gave her prescription pain medicine without ibuprofen but with allow her to take ibuprofen as needed in addition to the narcotic prescription. I clarify with the patient since I will not be following with her long-term, she would need to get her refill from another provider.

## 2017-01-01 ENCOUNTER — Telehealth: Payer: Self-pay | Admitting: *Deleted

## 2017-01-01 ENCOUNTER — Encounter: Payer: Self-pay | Admitting: *Deleted

## 2017-01-01 NOTE — Telephone Encounter (Signed)
Oncology Nurse Navigator Documentation  Emailed to ENT Dr. Constance Holster relevant information from Dr. Ritta Slot evaluation.  LVMM with his Medical Assistant Linus Orn to facilitate his awareness.  [name], RN, BSN, [place] [facility] Oncology Nurse [place] at [place] [phone]

## 2017-01-04 NOTE — Progress Notes (Signed)
A user error has taken place: encounter opened in error, closed for administrative reasons.

## 2017-01-05 ENCOUNTER — Ambulatory Visit (HOSPITAL_COMMUNITY): Payer: Self-pay | Admitting: Dentistry

## 2017-01-05 ENCOUNTER — Encounter (HOSPITAL_COMMUNITY): Payer: Self-pay | Admitting: Dentistry

## 2017-01-05 VITALS — BP 125/69 | HR 82 | Temp 97.6°F

## 2017-01-05 DIAGNOSIS — Z463 Encounter for fitting and adjustment of dental prosthetic device: Secondary | ICD-10-CM

## 2017-01-05 DIAGNOSIS — Z01818 Encounter for other preprocedural examination: Secondary | ICD-10-CM

## 2017-01-05 DIAGNOSIS — C023 Malignant neoplasm of anterior two-thirds of tongue, part unspecified: Secondary | ICD-10-CM

## 2017-01-05 NOTE — Progress Notes (Signed)
01/05/2017  Patient:            Sabrina Romero Date of Birth:  1966/10/11 MRN:                [id number]   BP 125/69 (BP Location: Left Arm)   Pulse 82   Temp 97.6 F (36.4 C) (Oral)   [name] is a 51 year old female that presents for continued fabrication of a radiation cone locator/tongue positioner. The patient has seen Dr. Constance Holster this morning. Patient with anticipated CT scan followed by surgery with Dr. Constance Holster for removal of the left neck mass. No apparent need for further left lateral tongue or floor of mouth surgery by patient report. Patient is aware that if for mouth or left lateral tongue is involved posterior molars may need to be extracted.  Initial portion of the fabrication of a tongue positioner/ radiation cone locator was done in the lab. The patient now presents for the clinical portion of tongue positioner fabrication in the dental chair.  Procedure: The initial tongue positioner fabrication was tried in the mouth. Good approximation of the device to the lower teeth was noted. The appliance was stable. Additional acrylic was added to allow for registration of the occlusion of the upper left and upper right teeth. The registration allowed for separation of teeth while providing an opening of approximately 1 cm from the upper teeth to the lower teeth.  Patient was instructed that this would provide a layer of safety to the upper teeth and palate during the administration of radiation therapy. The appliance was then adjusted and polished. Instruction was provided on the placement and removal of the tongue positioner/radiation cone locator by the patient.. The patient was able to insert and remove the appliance without difficulty.  This appliance will be taken to radiation oncology for future simulation appointment and provision of radiation therapy after there is confirmation of the anticipated surgery for the patient.    Lenn Cal, DDS

## 2017-01-06 ENCOUNTER — Other Ambulatory Visit: Payer: Self-pay | Admitting: Otolaryngology

## 2017-01-06 DIAGNOSIS — Z85810 Personal history of malignant neoplasm of tongue: Secondary | ICD-10-CM

## 2017-01-07 ENCOUNTER — Ambulatory Visit
Admission: RE | Admit: 2017-01-07 | Discharge: 2017-01-07 | Disposition: A | Discharge status: Home / Self Care | Payer: Medicare Other | Source: Ambulatory Visit | Attending: Otolaryngology | Admitting: Otolaryngology

## 2017-01-07 DIAGNOSIS — Z85810 Personal history of malignant neoplasm of tongue: Secondary | ICD-10-CM

## 2017-01-07 MED ORDER — IOPAMIDOL (ISOVUE-300) INJECTION 61%
75.0000 mL | Freq: Once | INTRAVENOUS | Status: AC | PRN
  Administered 2017-01-07: 75 mL via INTRAVENOUS

## 2017-01-19 ENCOUNTER — Ambulatory Visit: Payer: Medicare Other

## 2017-01-19 ENCOUNTER — Ambulatory Visit: Payer: Medicare Other | Admitting: Physical Therapy

## 2017-01-19 ENCOUNTER — Encounter: Payer: Medicare Other | Admitting: Nutrition

## 2017-01-21 ENCOUNTER — Telehealth: Payer: Self-pay | Admitting: *Deleted

## 2017-01-21 NOTE — Telephone Encounter (Signed)
Oncology Nurse Navigator Documentation  Spoke with April Holding, RN, Navigator, [facility], re coordination of dental extractions during patient's future surgery with Dr. Vicie Mutters.  Per Kim:  Dr. Vicie Mutters indicated surgical procedure will be extensive and require significant time in OR, he does not support additional procedures during that time, including dental and PEG placement.  Patient is aware of recommendation.    Surgery scheduled for 02/16/2017.  Suggest reasonable to move forward with dental extractions prior to surgery.  Drs. Kulinski and [employer] informed.  [name], RN, BSN, [place] [facility] Oncology Nurse [place] at [place] [phone]

## 2017-01-29 ENCOUNTER — Telehealth: Payer: Self-pay | Admitting: *Deleted

## 2017-01-29 ENCOUNTER — Encounter: Payer: Self-pay | Admitting: *Deleted

## 2017-01-29 NOTE — Telephone Encounter (Signed)
Oncology Nurse Navigator Documentation  Per patient's request, I issued/emailed letter in support of her sister's availability to provide support during surgical recovery and potential post-RT.  Letter in [place].  [name], RN, BSN, [place] [facility] Oncology Nurse [place] at [place] [phone]

## 2017-02-11 ENCOUNTER — Telehealth: Payer: Self-pay | Admitting: *Deleted

## 2017-02-11 NOTE — Telephone Encounter (Signed)
Oncology Nurse Navigator Documentation  LVMM and sent email to April Holding, Navigator, informing message left for Dr. Desmond Dike to call Dr. Enrique Sack, [facility], to discuss feasibility of dental procedure while Ms. Easterly is inpatient s/p 3/27 surgery with Dr. Vicie Mutters.  [name], RN, BSN, [place] [facility] Oncology Nurse [place] at [place] [phone]

## 2017-02-11 NOTE — Telephone Encounter (Signed)
Oncology Nurse Navigator Documentation  Spoke with Nira Conn, Dr. Dorinda Hill office [phone]).  Noted Dr. Enrique Sack, [facility], requests dental assist for patient s/p 3/27 surgery with Dr. Vicie Mutters, asked that she call Dr. Enrique Sack.  Heather voiced understanding.  Dr. Enrique Sack notified.  [name], RN, BSN, [place] [facility] Oncology Nurse [place] at [place] [phone]

## 2017-03-05 NOTE — Progress Notes (Signed)
Head and Neck Cancer Location of Tumor / Histology:  12/17/16 Left Submandibular lymph node biopsy: Positive for metastatic squamous cell carcarcinoma  10/28/2015 Tongue, Left, partial Glossectomy: -- Squamous cell carcinoma   Patient presented on 12/09/16 to her doctor in [place] with symptoms of: Left Ear pain for the past couple of months. She also reported experiencing increased discomfort along the Left tongue  Biopsies of  revealed:  10/28/2015 Tongue, Left, partial Glossectomy: -- Squamous cell carcinoma  12/17/16 Left Submandibular lymph node biopsy: Positive for metastatic squamous cell carcarcinoma  02/16/17 FINAL PATHOLOGIC DIAGNOSIS MICROSCOPIC EXAMINATION AND DIAGNOSIS  A.LYMPH NODES, LEFT NECK LEVEL 2B, DISSECTION:  Four benign lymph nodes, negative for malignancy (0/4).  B.SOFT TISSUE, CAROTID BIFURCATION, EXCISION:  Involved by invasive squamous cell carcinoma. Perineural invasion identified.  C.TONGUE AND NECK CONTENTS,LEFT, PARTIAL GLOSSECTOMY AND NECK DISSECTION:  Invasive squamous cell carcinoma, moderately-poorly differentiated, 2.5 cm in greatest dimension. Perineural invasion identified. No lymphvascular invasion identified. Base of tongue and lateral margins are positive for invasive carcinoma. One of three lymph nodes positive for metastatic carcinoma (1/3). The metastatic deposit is 0.9 cm. Extranodal extension present. Pathologic staging: [id number]     Nutrition Status Yes No Comments  Weight changes? [x]  []  She has lost weight gradually over the past 6 months. None since the surgery in March.  Swallowing concerns? [x]  []  She is able to swallow liquids only.   PEG? [x]  []  She is instilling 4 cans osmolite daily.    Referrals Yes No Comments  Social Work? []  [x]    Dentistry? [x]  []    Swallowing therapy? [x]  []  [facility]  Nutrition? [x]  []  [facility]  Med/Onc? [x]  []  Dr. Alvy Bimler 12/29/16    Safety Issues Yes No Comments  Prior radiation? []  [x]    Pacemaker/ICD? []  [x]    Possible current pregnancy? []  [x]    Is the patient on methotrexate? []  [x]     Tobacco/Marijuana/Snuff/ETOH use: Former remote smoker, no smokeless tobacco history.   Past/Anticipated interventions by otolaryngology, if any:  She is S/P Left oral tongue partial glossectomy on 10/28/15  02/16/17 On 02/16/17, she underwent L partial glossectomy, selective neck dissection with SCM flap, and tracheotomy (decannulated on 02/22/17) by Dr. Vicie Mutters at [facility].   02/21/17 On 02/21/17, she underwent laproscopic gastrostomy tube placement for permanent enteral feeding access.     Past/Anticipated interventions by medical oncology, if any:  Dr. Alvy Bimler 12/29/16 ASSESSMENT & PLAN:  Cancer of anterior two-thirds of tongue ([place]) I reviewed over 100 pages of outside records. Her case was reviewed at the recent ENT tumor board. The patient will be seeing ENT soon. She could consider local resection followed by postoperative radiation treatment only. I do not see a role for chemotherapy at this point.     Current Complaints / other details:    ENT [facility] and Radiation Oncologist Lilli Few in [place]. She moved to [place] on 12/23/16.  She was seen in consultation on 12/04/15 by Dr. Lilli Few (Radiation Oncologist). She was advised to pursue Radiation for her Stage  T2 Nx Mx, Left Tongue squamous cell carcinoma which was diagnosed on 09/23/15. She underwent treatment planning on 12/19/15 but when she presented for her first treatment she elected to not pursue treatment and decided to chose the surveillance route against medical advice.   PET 12/07/16 showed increased activity along the left lateral tongue as well as a new left submandibular node.   BP 98/61   Pulse 86   Temp 97.9 F (36.6 C)   Ht 5'  4" (1.626 m)   SpO2 100% Comment: room air

## 2017-03-10 ENCOUNTER — Ambulatory Visit
Admission: RE | Admit: 2017-03-10 | Discharge: 2017-03-10 | Disposition: A | Discharge status: Home / Self Care | Payer: Medicare Other | Source: Ambulatory Visit | Attending: Radiation Oncology | Admitting: Radiation Oncology

## 2017-03-10 ENCOUNTER — Encounter: Payer: Self-pay | Admitting: Radiation Oncology

## 2017-03-10 VITALS — BP 98/61 | HR 86 | Temp 97.9°F | Ht 64.0 in

## 2017-03-10 DIAGNOSIS — R5381 Other malaise: Secondary | ICD-10-CM

## 2017-03-10 DIAGNOSIS — Z510 Encounter for antineoplastic radiation therapy: Secondary | ICD-10-CM | POA: Diagnosis not present

## 2017-03-10 DIAGNOSIS — C023 Malignant neoplasm of anterior two-thirds of tongue, part unspecified: Secondary | ICD-10-CM

## 2017-03-10 DIAGNOSIS — Z1329 Encounter for screening for other suspected endocrine disorder: Secondary | ICD-10-CM

## 2017-03-10 NOTE — Progress Notes (Signed)
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 Screening for hypothyroidism V77.0 Z13.29   2. Cancer of anterior two-thirds of tongue (HCC) 141.4 C02.3 Comprehensive metabolic panel     CBC with Differential     CT Soft Tissue Neck W Contrast     CT Chest W Contrast     Ambulatory referral to Nutrition and Diabetic Education     TSH  3. [place] 780.79 R53.81 TSH    CHIEF COMPLAINT: Here to discuss management of Recurrent Oral Tongue cancer  HISTORY OF PRESENT ILLNESS::Sabrina Romero is a 51 y.o. female who was initially seen in consultation on 12/29/16.  I spoke personally with Dr. Vicie Mutters last week about the patient. He reported her disease was very aggressive and he was not able to resect all the gross disease as it reached the carotid bifurcation. Her pathologic stage is rpT4b, pN2a. She had a metastasis in a single ipsilateral node with extracapsular extension. A total of 7 nodes were removed. The primary tumor from the left tongue was moderately/poorly differentiated invasive squamous cell carcinoma, 2.5 cm in greatest dimension. There was PNI but no LVSI. Margins were positive laterally and at the base of tongue. Surgery was on 02/16/17. Despite our recommendations to coordinate care, she refused dental extractions at the time of her tongue/neck surgery. The last time she has undergone imaging of her chest was when she underwent PET scan in [place] on 12/07/16. At that time she did not have evidence of distant metastatic disease.  Today the patient reports to the clinic with her husband to review the role that  radiotherapy may play in the treatment of the patient's disease.  On review of systems, the patient has lost weight gradually over the past 6 months. She denies any further weight loss since surgery in March. She reports she is able to swallow liquids only, and is only swallowing saliva at this time. She has a PEG tube in place and is instilling 4 cans of Osmolite daily.   Of note, the patient reports she is not inclined to undergo recommended dental extractions. She consulted with Dr. Alvy Bimler on 12/29/16, who did not recommend chemotherapy but was open to considering this based on final pathology. Patient has a speech therapist at [facility] who is seeing her next week.   PREVIOUS RADIATION THERAPY: No  PAST MEDICAL HISTORY:  has a past medical history of Hypertension; Multiple sclerosis ([place]); Seasonal allergies; and Squamous cell cancer of tongue ([place]) (10/08/2015).    PAST SURGICAL HISTORY: Past Surgical History:  Procedure Laterality Date  . ABDOMINAL HYSTERECTOMY    . KNEE SURGERY Bilateral   . PARTIAL GLOSSECTOMY Left 10/28/2015   Left oral Tongue partial glossectomy.   . TUBAL LIGATION    . WISDOM TOOTH EXTRACTION      FAMILY HISTORY: family history includes Diabetes in her father; Heart disease in her mother.  SOCIAL HISTORY:  reports that she quit smoking about 20 years ago. She has never used smokeless tobacco. She reports that she drinks alcohol. She reports that she does not use drugs. The patient and her  husband have recently moved to [place] from [place] and have recently purchased and moved into their new home; they were previously staying in an Extended Stay hotel.   ALLERGIES: Sulfa antibiotics  MEDICATIONS:  Current Outpatient Prescriptions  Medication Sig Dispense Refill  . baclofen (LIORESAL) 10 MG tablet TAKE UP TO 4 TABLETS DAILY, IN DIVIDED DOSES.  99  . Cholecalciferol (VITAMIN D3) 2000 units capsule Take by mouth    . Glatiramer Acetate (COPAXONE) 40 MG/ML SOSY  Inject 40 mg into the skin 3 (three) times a week.     Marland Kitchen lisinopril (PRINIVIL,ZESTRIL) 20 MG tablet Take 20 mg by mouth daily.  0  . metoprolol tartrate (LOPRESSOR) 25 MG tablet Take 25 mg by mouth.    . cetirizine (ZYRTEC) 10 MG tablet Take 10 mg by mouth.    . hydrocodone-ibuprofen (VICOPROFEN) 5-200 MG tablet Take 1 tablet by mouth every 4 (four) hours as needed for pain.    . Multiple Vitamin (MULTI-VITAMINS) TABS Take by mouth    . naproxen (NAPROSYN) 500 MG tablet   0  . Omega-3 Fatty Acids (FISH OIL PO) Take by mouth    . ondansetron (ZOFRAN) 8 MG tablet Take 1 tablet (8 mg total) by mouth every 8 (eight) hours as needed for nausea. for nausea (Patient not taking: Reported on 12/30/2016) 60 tablet 0  . oxyCODONE 10 MG TABS Take 1 tablet (10 mg total) by mouth every 6 (six) hours as needed for severe pain. (Patient not taking: Reported on 03/10/2017) 60 tablet 0  . sodium fluoride (FLUORISHIELD) 1.1 % GEL dental gel Instill one drop of gel per tooth space of fluoride tray. Place over teeth for 5 minutes. Remove. Spit out excess. Repeat nightly. (Patient not taking: Reported on 03/10/2017) 120 mL prn   No current facility-administered medications for this encounter.     REVIEW OF SYSTEMS:  A 10+ POINT REVIEW OF SYSTEMS WAS OBTAINED including neurology, dermatology, psychiatry, cardiac, respiratory, lymph, extremities, GI, GU, Musculoskeletal, constitutional, breasts, reproductive, HEENT.  All pertinent positives are noted in the HPI.  All others are negative.   PHYSICAL EXAM:  height is 5\' 4"  (1.626 m). Her temperature is 97.9 F (36.6 C). Her blood pressure is 98/61 and her pulse is 86. Her oxygen saturation is 100%.   General: Alert and oriented, in no acute distress. In wheelchair.  HEENT: In the left floor of mouth bordering on the left anterior tongue there is some foul smelling greyish tissue that appears to be necrotic, post surgical in nature. She is scheduled to see Dr. Vicie Mutters next week  and reports that he may be removing some of that tissue at that time. Mucous membranes are moist. Neck: In the left upper neck she has a healing surgical scar that is somewhat tender to palpation. She has a vertical scar from former tracheostomy. 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 No adenopathy in cervical or supraclavicular neck. Abdomen: Peg tube in place in left upper abdomen with minimal drainage around PEG site. Lymphatics: see Neck Exam Skin: No concerning lesions. Neurologic:  Speech is fluent.  Provides a good history.  Alert, oriented. No gross CN deficits. Psychiatric: Judgment and insight are intact. Affect is appropriate.   ECOG = 3    LABORATORY DATA:    :     [facility] labs:     RADIOGRAPHY: AS ABOVE    IMPRESSION/PLAN:  This is a delightful patient with recurrent head and neck cancer - oral tongue. HIGH  RISK.  She understands less than half of patients achieve long term cure with her type of disease and adjuvant therapy; but, she is still eligible for curative therapy in terms of radiation.  I would recommend  6-7 weeks of adjuvant radiotherapy for this patient, +/- chemotherapy. We again discussed goals of care. She wants to achieve cure.   We again discussed the potential risks, benefits, and side effects of radiotherapy. We talked in detail about acute and late effects. We discussed that some of the most bothersome acute effects may be mucositis, dysgeusia, salivary changes, skin irritation, hair loss, dehydration, weight loss and fatigue. We talked about late effects which include but are not necessarily limited to dysphagia, hypothyroidism, nerve injury, spinal cord injury, xerostomia, trismus, and neck edema. No guarantees of treatment were given. A consent form was previously signed and placed in the patient's medical record. The patient is enthusiastic about proceeding with treatment. I look forward to participating in the patient's care.      CT Simulation (treatment planning) will be scheduled soon. The patient prefers appointments in the morning, ideally before 10 am.  I will order a CT neck and chest for restaging purposes.  Follow up with Dr. Enrique Sack asap to confirm proper fit of dental guard following surgery.  I will present this patient's case at the next tumor board, as well as refer the patient back to Dr. Alvy Bimler for further consideration of chemotherapy in conjunction with radiotherapy.  For now she will defer on SW referral as well as PT referral at this time. The patient will be referred to nutrition to follow closely with the patient during treatment. SLP is following at [facility].  I will CC Dr. Vicie Mutters, Dr. Alvy Bimler, and Dr. Enrique Sack on my findings today.  I spent 35 minutes face to face with the patient and more than 50% of that time was spent in counseling and/or coordination of care. __________________________________________   Eppie Gibson, MD  This document serves as a record of services personally performed by Eppie Gibson, MD. It was created on her behalf by Maryla Morrow, a trained medical scribe. The creation of this record is based on the scribe's personal observations and the provider's statements to them. This document has been checked and approved by the attending provider.

## 2017-03-11 ENCOUNTER — Telehealth: Payer: Self-pay | Admitting: *Deleted

## 2017-03-11 NOTE — Telephone Encounter (Signed)
CALLED PATIENT TO INFORM OF CT ON 03-18-17 AND HER NUTRITION APPT. ON 03-22-17 WITH BARBARA NEFF, LVM FOR A RETURN CALL

## 2017-03-15 ENCOUNTER — Telehealth: Payer: Self-pay | Admitting: *Deleted

## 2017-03-15 ENCOUNTER — Telehealth (HOSPITAL_COMMUNITY): Payer: Self-pay | Admitting: Dentistry

## 2017-03-15 NOTE — Telephone Encounter (Signed)
03/15/17 Pt's husband, Marland Kitchen called and stated appt. for wife on 03/16/17 @ 1:30 w/Dr. Enrique Sack would need to be reshcl. due to work. No appt. schl. at this

## 2017-03-15 NOTE — Telephone Encounter (Signed)
Oncology Nurse Navigator Documentation  Returned Sabrina Romero's call.  We discussed need to reschedule appt with Dental Medicine for fitting of mouth piece prior to 4/30 CT SIM.  She indicated best for Dental Medicine to contact her husband to schedule as he provides transportation.  I confirmed his phone number listed in Demographics.  Note routed to Dr. Enrique Sack.  [name], RN, BSN, [place] [facility] Oncology Nurse [place] at [place] [phone]

## 2017-03-15 NOTE — Telephone Encounter (Signed)
Oncology Nurse Navigator Documentation  Called Ms. Marsan to address appt scheduling.  LVMM, asked her to return my call.  [name], RN, BSN, [place] [facility] Oncology Nurse [place] at [place] [phone]

## 2017-03-16 ENCOUNTER — Telehealth: Payer: Self-pay | Admitting: Hematology and Oncology

## 2017-03-16 ENCOUNTER — Encounter (HOSPITAL_COMMUNITY): Payer: Self-pay | Admitting: Dentistry

## 2017-03-16 NOTE — Telephone Encounter (Signed)
Left message re f/u 4/30.

## 2017-03-18 ENCOUNTER — Ambulatory Visit (HOSPITAL_COMMUNITY)
Admission: RE | Admit: 2017-03-18 | Discharge: 2017-03-18 | Disposition: A | Discharge status: Home / Self Care | Payer: Medicare Other | Source: Ambulatory Visit | Attending: Radiation Oncology | Admitting: Radiation Oncology

## 2017-03-18 ENCOUNTER — Encounter (HOSPITAL_COMMUNITY): Payer: Self-pay | Admitting: Dentistry

## 2017-03-18 ENCOUNTER — Ambulatory Visit (HOSPITAL_COMMUNITY): Payer: Medicaid - Dental | Admitting: Dentistry

## 2017-03-18 ENCOUNTER — Ambulatory Visit
Admission: RE | Admit: 2017-03-18 | Discharge: 2017-03-18 | Disposition: A | Discharge status: Home / Self Care | Payer: Medicare Other | Source: Ambulatory Visit | Attending: Radiation Oncology | Admitting: Radiation Oncology

## 2017-03-18 VITALS — BP 129/66 | HR 92 | Temp 97.8°F

## 2017-03-18 DIAGNOSIS — C023 Malignant neoplasm of anterior two-thirds of tongue, part unspecified: Secondary | ICD-10-CM

## 2017-03-18 DIAGNOSIS — Z01818 Encounter for other preprocedural examination: Secondary | ICD-10-CM

## 2017-03-18 DIAGNOSIS — Z9889 Other specified postprocedural states: Secondary | ICD-10-CM | POA: Insufficient documentation

## 2017-03-18 DIAGNOSIS — R918 Other nonspecific abnormal finding of lung field: Secondary | ICD-10-CM | POA: Diagnosis not present

## 2017-03-18 DIAGNOSIS — R5381 Other malaise: Secondary | ICD-10-CM

## 2017-03-18 DIAGNOSIS — Z463 Encounter for fitting and adjustment of dental prosthetic device: Secondary | ICD-10-CM

## 2017-03-18 LAB — CBC WITH DIFFERENTIAL/PLATELET
BASO%: 0.3 % (ref 0.0–2.0)
BASOS ABS: 0 10*3/uL (ref 0.0–0.1)
EOS ABS: 0.3 10*3/uL (ref 0.0–0.5)
EOS%: 4.6 % (ref 0.0–7.0)
HCT: 35.9 % (ref 34.8–46.6)
HGB: 11.9 g/dL (ref 11.6–15.9)
LYMPH%: 32.9 % (ref 14.0–49.7)
MCH: 31.2 pg (ref 25.1–34.0)
MCHC: 33.1 g/dL (ref 31.5–36.0)
MCV: 94.2 fL (ref 79.5–101.0)
MONO#: 0.5 10*3/uL (ref 0.1–0.9)
MONO%: 8.4 % (ref 0.0–14.0)
NEUT#: 3.4 10*3/uL (ref 1.5–6.5)
NEUT%: 53.8 % (ref 38.4–76.8)
Platelets: 287 10*3/uL (ref 145–400)
RBC: 3.81 10*6/uL (ref 3.70–5.45)
RDW: 13.6 % (ref 11.2–14.5)
WBC: 6.3 10*3/uL (ref 3.9–10.3)
lymph#: 2.1 10*3/uL (ref 0.9–3.3)

## 2017-03-18 LAB — COMPREHENSIVE METABOLIC PANEL
ALT: 25 U/L (ref 0–55)
AST: 16 U/L (ref 5–34)
Albumin: 3.8 g/dL (ref 3.5–5.0)
Alkaline Phosphatase: 133 U/L (ref 40–150)
Anion Gap: 9 mEq/L (ref 3–11)
BILIRUBIN TOTAL: 0.37 mg/dL (ref 0.20–1.20)
BUN: 14.4 mg/dL (ref 7.0–26.0)
CALCIUM: 10.2 mg/dL (ref 8.4–10.4)
CHLORIDE: 101 meq/L (ref 98–109)
CO2: 28 meq/L (ref 22–29)
CREATININE: 0.8 mg/dL (ref 0.6–1.1)
EGFR: 90 mL/min/{1.73_m2} — ABNORMAL LOW (ref >90)
Glucose: 93 mg/dl (ref 70–140)
Potassium: 4.7 mEq/L (ref 3.5–5.1)
Sodium: 137 mEq/L (ref 136–145)
TOTAL PROTEIN: 7.2 g/dL (ref 6.4–8.3)

## 2017-03-18 LAB — TSH: TSH: 1.853 m(IU)/L (ref 0.308–3.960)

## 2017-03-18 MED ORDER — IOPAMIDOL (ISOVUE-300) INJECTION 61%
75.0000 mL | Freq: Once | INTRAVENOUS | Status: AC | PRN
  Administered 2017-03-18: 75 mL via INTRAVENOUS

## 2017-03-18 MED ORDER — IOPAMIDOL (ISOVUE-300) INJECTION 61%
INTRAVENOUS | Status: AC
  Filled 2017-03-18: qty 75

## 2017-03-18 MED ORDER — SODIUM FLUORIDE 1.1 % DT CREA: TOPICAL_CREAM | DENTAL | 99 refills | Status: AC

## 2017-03-18 MED FILL — SF 1.1% GEL: 1.1 | 30 days supply | Qty: 56 | Fill #0

## 2017-03-18 NOTE — Patient Instructions (Signed)

## 2017-03-18 NOTE — Progress Notes (Signed)
Has armband been applied?  yes  Does patient have an allergy to IV contrast dye?: no   Has patient ever received premedication for IV contrast dye?:  yes  Does patient take metformin?: no  If patient does take metformin when was the last dose:  Date of lab work: 03/18/17 BUN: 14.4 CR: .8  IV site: left hand  Has IV site been added to flowsheet?  yes

## 2017-03-18 NOTE — Progress Notes (Signed)
03/18/2017  Patient Name:   Tisha Cline Date of Birth:   [birth year] Medical Record Number: [id number]  BP 129/66 (BP Location: Left Arm)   Pulse 92   Temp 97.8 F (36.6 C) (Oral)   Cyla Haluska now presents for insertion of upper and lower fluoride trays and re-tryin of the tongue positioner. Patient now has decrease in maximum interincisal opening to 22 mm down from 40 mm. Tongue positioner was adjusted to new opening and occlusion of appliance was remade. Patient is able to accomadate the new appliance as remade. Patient is able to get the appliance in and out without problems. Polished appliance.  Fluoride trays were tried in and adjusted as needed.  Postop instructions were provided and a written and verbal format concerning the use and care of appliances. All questions were answered. Patient to return to clinic for periodic oral examination in approximately 2-3weeks during radiation therapy. Rx for Prevident 5000 Plus was sent to [facility] until FluoriSHIELD is restocked from [place] at [facility]. Patient to call if questions or problems arise before then.  Lenn Cal, DDS

## 2017-03-22 ENCOUNTER — Ambulatory Visit
Admission: RE | Admit: 2017-03-22 | Discharge: 2017-03-22 | Disposition: A | Discharge status: Home / Self Care | Payer: Medicare Other | Source: Ambulatory Visit | Attending: Radiation Oncology | Admitting: Radiation Oncology

## 2017-03-22 ENCOUNTER — Ambulatory Visit (HOSPITAL_BASED_OUTPATIENT_CLINIC_OR_DEPARTMENT_OTHER): Payer: Medicare Other | Admitting: Hematology and Oncology

## 2017-03-22 ENCOUNTER — Encounter: Payer: Self-pay | Admitting: Nutrition

## 2017-03-22 VITALS — BP 112/73 | HR 88 | Temp 97.8°F

## 2017-03-22 DIAGNOSIS — C023 Malignant neoplasm of anterior two-thirds of tongue, part unspecified: Secondary | ICD-10-CM

## 2017-03-22 DIAGNOSIS — G893 Neoplasm related pain (acute) (chronic): Secondary | ICD-10-CM

## 2017-03-22 DIAGNOSIS — R918 Other nonspecific abnormal finding of lung field: Secondary | ICD-10-CM

## 2017-03-22 MED ORDER — SODIUM CHLORIDE 0.9% FLUSH: 10.0000 mL | Freq: Once | INTRAVENOUS | Status: DC

## 2017-03-22 MED ORDER — SCOPOLAMINE 1 MG/3DAYS TD PT72: 1.0000 | MEDICATED_PATCH | TRANSDERMAL | 2 refills | Status: AC

## 2017-03-22 MED FILL — TRANSDERM-SCOP 1.5 MG/3 DAY: 1 | 30 days supply | Qty: 10 | Fill #0

## 2017-03-22 NOTE — Progress Notes (Signed)
Note to chart  Outpatient  Diagnosis:    ICD-9-CM ICD-10-CM   1. Cancer of anterior two-thirds of tongue ([place]) 141.4 C02.3    Patient could not tolerate simulation/ supine position today due to copious saliva.  Scopolamine patch Rx'd. Re-attempt later this week.  Discussed CT chest results with pt and husband.  Concern for possible lung mets.  In light of this, Dr Alvy Bimler did not recommend concurrent chemo at tumor board. Patient still wants to proceed with RT alone, and have the benefit of the doubt that disease may still be contained to neck.  We will re-scan lungs after treatment.  She understands risks and benefits and alternatives of a radiotherapy course.    -----------------------------------  Eppie Gibson, MD

## 2017-03-23 ENCOUNTER — Encounter: Payer: Self-pay | Admitting: Hematology and Oncology

## 2017-03-23 NOTE — Assessment & Plan Note (Signed)
She has mild pain after surgery I would defer to her radiation oncologist for pain management

## 2017-03-23 NOTE — Assessment & Plan Note (Signed)
Recent surgical pathology showed evidence of high risk disease and residual disease CT imaging results were discussed with the patient and her husband The new pulmonary nodules could very well be due to new metastatic disease She is not symptomatic The lesions are too small for biopsy I told her the implication of possible diagnosis of metastatic cancer which means that treatment with radiation is not considered of curative intent Given high risk of local recurrence that could cause significant mobilities, ultimately, the patient and her husband agree not to pursue systemic chemotherapy, rather, to focus on radiation treatment to prevent local recurrence I plan to see her back after she completes radiation treatment as I suspect the lung nodules are metastatic in nature and she would very well need systemic treatment very soon

## 2017-03-23 NOTE — Assessment & Plan Note (Signed)
I reviewed imaging study results Both declined to review the imaging study She is not symptomatic I told him the implication of the findings They understood that it could represent metastatic disease That needs to be observed with serial imaging study after she completes radiation treatment

## 2017-03-23 NOTE — Progress Notes (Signed)
[facility] OFFICE PROGRESS NOTE  Patient Care Team: No Pcp Per Patient as PCP - General (General Practice) Philomena Doheny, MD as Referring Physician (Plastic Surgery) Eppie Gibson, MD as Attending Physician (Radiation Oncology) Heath Lark, MD as Consulting Physician (Hematology and Oncology) Leota Sauers, RN as Oncology Nurse [place], RD as Dietitian (Nutrition)  SUMMARY OF ONCOLOGIC HISTORY: Oncology History   Outside PET scan showed no evidence of disease. She doesn't some abnormal uptake on the left side of the tongue.     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 She had biopsy which show moderately differentiated squamous cell carcinoma, at least 2.5 mm depth.      10/15/2015 Imaging    Outside CT scan showed no evidence of other disease      10/28/2015 Surgery    She had left oral tongue partial glossectomy which revealed 2.5 cm squamous cell carcinoma, highly infiltrated and ulcerated, moderate to poorly differentiated with lymphovascular invasion and perineural invasion. Margins are negative. The patient refused adjuvant treatment and wanted only surveillance follow-up imaging      10/28/2015 Pathology Results    Surgical pathology revealed squamous cell carcinoma, [phone] cm, 1.1 cm thick, ulcerated, evidence of lymphovascular invasion and perineural invasion, moderate to poorly differentiated, margins were close at 0.6 mm posteriorly      12/10/2015 PET scan    Outside PET scan showed no evidence of disease but there are abnormal uptake      03/27/2016 PET scan    Outside PET/CT scan showed diffuse physiologic activity in the oral cavity and oropharynx. No evidence of distant metastatic disease.      07/07/2016 PET scan    Outside PET/CT scan show possibility of presence of recurrent tumor.      07/16/2016  Imaging    She had outside MRI of the neck which show abnormal enhancement within the left inferior lateral tongue, extending to partially involve the surrounding tissue.      08/21/2016 PET scan    Outside PET/CT scan show activity along the left lateral tongue and adjacent soft tissue      11/30/2016 Miscellaneous    She has symptoms of pathology and discomfort near the TMJ and along the left side of her tongue.      12/07/2016 PET scan    Outside PET/CT scan show focal uptake in the left lower mild/inferior aspect of the left tongue and lymphadenopathy in the anterior portion of the left submandibular gland involving an abnormal lymph node.      12/17/2016 Procedure    She underwent ultrasound-guided left submandibular lymph node biopsy      12/17/2016 Pathology Results    Biopsy confirmed metastatic squamous cell carcinoma      01/07/2017 Imaging    CT neck: 1. Appearance highly suspicious for recurrent tumor in the substance of the left tongue, size estimated at 28 x 22 x 23 mm and progressed since 07/16/2016. See series 3, image 36, coronal image 32, and sagittal image 54 2. Suspicious left level Ib and left level IIa lymph nodes, possibly with extracapsular extension at the latter. 3. Follow-up PET-CT would be complementary. 4. Architectural distortion anterior and posterior to the left submandibular gland which may be mildly obstructed and seems to correspond to the palpable abnormality      02/16/2017 Surgery  She underwent L partial glossectomy, selective neck dissection with SCM flap, and tracheotomy. The procedure was tolerated without complication. She resumed feeds via dobhoff tube. The trach was downgraded to cuffless, and then decannulated on 02/22/17 without difficulty.         02/16/2017 Pathology Results    A.LYMPH NODES, LEFT NECK LEVEL 2B, DISSECTION:  Four benign lymph nodes, negative for malignancy (0/4).  B.SOFT TISSUE, CAROTID BIFURCATION, EXCISION:   Involved by invasive squamous cell carcinoma. Perineural invasion identified.  C.TONGUE AND NECK CONTENTS,LEFT, PARTIAL GLOSSECTOMY AND NECK DISSECTION:  Invasive squamous cell carcinoma, moderately-poorly differentiated, 2.5 cm in greatest dimension. Perineural invasion identified. No lymphvascular invasion identified. Base of tongue and lateral margins are positive for invasive carcinoma. One of three lymph nodes positive for metastatic carcinoma (1/3). The metastatic deposit is 0.9 cm. Extranodal extension present. Pathologic staging: [id number] See cancer protocol.      02/21/2017 Procedure    She underwent laproscopic gastrostomy tube placement for permanent enteral feeding access.          03/18/2017 Imaging    CT chest: Interval development of tiny bilateral pulmonary nodules. Given the history of head and neck cancer, close follow-up recommended.      03/18/2017 Imaging    CT neck: Post treatment changes status post partial LEFT glossectomy and modified radical neck resection for recurrent squamous cell carcinoma, 1 month ago. The reported residual tumor surrounding the LEFT carotid bifurcation is inseparable from postoperative fluid/scarring. Continued surveillance warranted       INTERVAL HISTORY: Please see below for problem oriented charting. She returns for further follow-up I review outside records extensively and review recent imaging She underwent surgery Surgery was incomplete and residual disease is present Recent CT imaging show possible new metastatic cancer to her lungs She is not symptomatic She is doing well after surgery Pain is under good control She is using her feeding tube for nutritional needs  REVIEW OF SYSTEMS:   Constitutional: Denies fevers, chills or abnormal weight loss Eyes: Denies blurriness of vision Ears, nose, mouth, throat, and face: Denies mucositis or sore throat Respiratory:  Denies cough, dyspnea or wheezes Cardiovascular: Denies palpitation, chest discomfort or lower extremity swelling Gastrointestinal:  Denies nausea, heartburn or change in bowel habits Skin: Denies abnormal skin rashes Lymphatics: Denies new lymphadenopathy or easy bruising Neurological:Denies numbness, tingling or new weaknesses Behavioral/Psych: Mood is stable, no new changes  All other systems were reviewed with the patient and are negative.  I have reviewed the past medical history, past surgical history, social history and family history with the patient and they are unchanged from previous note.  ALLERGIES:  is allergic to sulfa antibiotics.  MEDICATIONS:  Current Outpatient Prescriptions  Medication Sig Dispense Refill  . bacitracin 500 UNIT/GM ointment   0  . baclofen (LIORESAL) 10 MG tablet TAKE UP TO 4 TABLETS DAILY, IN DIVIDED DOSES.  99  . cetirizine (ZYRTEC) 10 MG tablet Take 10 mg by mouth.    . Cholecalciferol (VITAMIN D3) 2000 units capsule Take by mouth    . Glatiramer Acetate (COPAXONE) 40 MG/ML SOSY Inject 40 mg into the skin 3 (three) times a week.     . hydrocodone-ibuprofen (VICOPROFEN) 5-200 MG tablet Take 1 tablet by mouth every 4 (four) hours as needed for pain.    Marland Kitchen lisinopril (PRINIVIL,ZESTRIL) 20 MG tablet Take 20 mg by mouth daily.  0  . metoprolol tartrate (LOPRESSOR) 25 MG tablet Take 25 mg by mouth.    . Multiple Vitamin (  MULTI-VITAMINS) TABS Take by mouth    . naproxen (NAPROSYN) 500 MG tablet   0  . Omega-3 Fatty Acids (FISH OIL PO) Take by mouth    . scopolamine (TRANSDERM-SCOP) 1 MG/3DAYS Place 1 patch (1.5 mg total) onto the skin every 3 (three) days. This may help thick saliva/secretions. 10 patch 2  . sodium fluoride (PREVIDENT 5000 PLUS) 1.1 % CREA dental cream Apply to tooth brush. Brush teeth for 2 minutes. Spit out excess. Repeat nightly. 1 Tube prn  . ondansetron (ZOFRAN) 8 MG tablet Take 1 tablet (8 mg total) by mouth every 8 (eight) hours as  needed for nausea. for nausea (Patient not taking: Reported on 12/30/2016) 60 tablet 0  . oxyCODONE 10 MG TABS Take 1 tablet (10 mg total) by mouth every 6 (six) hours as needed for severe pain. (Patient not taking: Reported on 03/10/2017) 60 tablet 0  . sodium fluoride (FLUORISHIELD) 1.1 % GEL dental gel Instill one drop of gel per tooth space of fluoride tray. Place over teeth for 5 minutes. Remove. Spit out excess. Repeat nightly. (Patient not taking: Reported on 03/10/2017) 120 mL prn   No current facility-administered medications for this visit.     PHYSICAL EXAMINATION: ECOG PERFORMANCE STATUS: 2 - Symptomatic, <50% confined to bed  Vitals:   03/22/17 1245  BP: 107/70  Pulse: 84  Resp: 18  Temp: 98.4 F (36.9 C)   Filed Weights    GENERAL:alert, no distress and comfortable SKIN: skin color, texture, turgor are normal, no rashes or significant lesions EYES: normal, Conjunctiva are pink and non-injected, sclera clear OROPHARYNX: well-healed hemiglossectomy changes in her mouth NECK: Well-healed surgical scar  LYMPH:  no palpable lymphadenopathy in the cervical, axillary or inguinal LUNGS: clear to auscultation and percussion with normal breathing effort HEART: regular rate & rhythm and no murmurs and no lower extremity edema ABDOMEN:abdomen soft, non-tender and normal bowel sounds Musculoskeletal:no cyanosis of digits and no clubbing  NEURO: alert & oriented x 3 with fluent speech, no focal motor/sensory deficits  LABORATORY DATA:  I have reviewed the data as listed    Component Value Date/Time   NA 137 03/18/2017 1003   K 4.7 03/18/2017 1003   CO2 28 03/18/2017 1003   GLUCOSE 93 03/18/2017 1003   BUN 14.4 03/18/2017 1003   CREATININE 0.8 03/18/2017 1003   CALCIUM 10.2 03/18/2017 1003   PROT 7.2 03/18/2017 1003   ALBUMIN 3.8 03/18/2017 1003   AST 16 03/18/2017 1003   ALT 25 03/18/2017 1003   ALKPHOS 133 03/18/2017 1003   BILITOT 0.37 03/18/2017 1003    No results  found for: SPEP, UPEP  Lab Results  Component Value Date   WBC 6.3 03/18/2017   NEUTROABS 3.4 03/18/2017   HGB 11.9 03/18/2017   HCT 35.9 03/18/2017   MCV 94.2 03/18/2017   PLT 287 03/18/2017      Chemistry      Component Value Date/Time   NA 137 03/18/2017 1003   K 4.7 03/18/2017 1003   CO2 28 03/18/2017 1003   BUN 14.4 03/18/2017 1003   CREATININE 0.8 03/18/2017 1003      Component Value Date/Time   CALCIUM 10.2 03/18/2017 1003   ALKPHOS 133 03/18/2017 1003   AST 16 03/18/2017 1003   ALT 25 03/18/2017 1003   BILITOT 0.37 03/18/2017 1003       RADIOGRAPHIC STUDIES: I have personally reviewed the radiological images as listed and agreed with the findings in the report. Ct Soft Tissue  Neck W Contrast  Result Date: 03/18/2017 CLINICAL DATA:  Continued surveillance tongue squamous cell cancer. Excerpt from Op Note 02/16/2017: The patient had a recurrent carcinoma that primarily was in the deep tongue muscle underneath the intact mucosa of the left floor of mouth with extension into the neck to involve the common carotid artery and bifurcation. A wide resection of the tumor was achieved with preservation of the dorsum of the tongue and near total resection of the mass with the exception of that was adherent to the external carotid artery at the bifurcation. The defect closed primarily with preservation of tongue motion. A sternocleidomastoid flap was used to fill the defect created by removal of the tumor. EXAM: CT NECK WITH CONTRAST TECHNIQUE: Multidetector CT imaging of the neck was performed using the standard protocol following the bolus administration of intravenous contrast. CONTRAST:  94mL ISOVUE-300 IOPAMIDOL (ISOVUE-300) INJECTION 61% COMPARISON:  CT neck 01/07/2017. FINDINGS: Pharynx and larynx: Status post partial LEFT glossectomy. Residual tumor not excluded. Multiple surgical clips are seen near the median raphe of the tongue. There is extensive fluid and/or early scarring  extending from the LEFT sublingual and submandibular spaces downward along the LEFT carotid space. Based on operative report, at least some of this soft tissue surrounding the carotid bifurcation represents residual tumor. LEFT sternocleidomastoid has been partially resected/rotated to form a flap. Given the recent surgery (1 month), the moderate fluid and edema is not unexpected, and residual/recurrent disease is best evaluated on follow-up scans with today's study serves as baseline scan. Salivary glands: LEFT submandibular gland is surgically absent. Thyroid: Normal. Lymph nodes: Enlarged LEFT level 2 lymph nodes have been resected Vascular: A portion of the LEFT internal jugular has been resected. Other vessels appear patent. Limited intracranial: Negative. Visualized orbits: Negative. Mastoids and visualized paranasal sinuses: Clear. Skeleton: No acute or aggressive process. Upper chest: Reported separately Other: None IMPRESSION: Post treatment changes status post partial LEFT glossectomy and modified radical neck resection for recurrent squamous cell carcinoma, 1 month ago. The reported residual tumor surrounding the LEFT carotid bifurcation is inseparable from postoperative fluid/scarring. Continued surveillance warranted. Electronically Signed   By: Staci Righter M.D.   On: 03/18/2017 12:29   Ct Chest W Contrast  Result Date: 03/18/2017 CLINICAL DATA:  Head and neck cancer EXAM: CT CHEST WITH CONTRAST TECHNIQUE: Multidetector CT imaging of the chest was performed during intravenous contrast administration. CONTRAST:  39mL ISOVUE-300 IOPAMIDOL (ISOVUE-300) INJECTION 61% COMPARISON:  PET-CT 12/07/2016. FINDINGS: Cardiovascular: The heart size is normal. No pericardial effusion. Coronary artery calcification is noted. Mediastinum/Nodes: No mediastinal lymphadenopathy. There is no hilar lymphadenopathy. The esophagus has normal imaging features. There is no axillary lymphadenopathy. Lungs/Pleura: 4 mm  perifissural nodule right middle lobe (image 62 series 11) is unchanged in the interval. 5 mm nodule superior segment right lower lobe image 56 series 11 is new in the interval. 5 mm nodule identified left lower lobe (image 56 series 11) is new in the interval left lower lobe calcified granuloma unchanged. 4 mm left lower lobe nodule seen on image 74 is new. No focal airspace consolidation. No pulmonary edema or pleural effusion. Upper Abdomen: Gastrostomy tube noted, but incompletely visualized. No adrenal nodule or mass. Scarring noted right kidney. Musculoskeletal: Bone windows reveal no worrisome lytic or sclerotic osseous lesions. IMPRESSION: 1. Interval development of tiny bilateral pulmonary nodules. Given the history of head and neck cancer, close follow-up recommended. Electronically Signed   By: Misty Stanley M.D.   On: 03/18/2017 16:06  ASSESSMENT & PLAN:  Cancer of anterior two-thirds of tongue [facility]) Recent surgical pathology showed evidence of high risk disease and residual disease CT imaging results were discussed with the patient and her husband The new pulmonary nodules could very well be due to new metastatic disease She is not symptomatic The lesions are too small for biopsy I told her the implication of possible diagnosis of metastatic cancer which means that treatment with radiation is not considered of curative intent Given high risk of local recurrence that could cause significant mobilities, ultimately, the patient and her husband agree not to pursue systemic chemotherapy, rather, to focus on radiation treatment to prevent local recurrence I plan to see her back after she completes radiation treatment as I suspect the lung nodules are metastatic in nature and she would very well need systemic treatment very soon   Multiple lung nodules on CT I reviewed imaging study results Both declined to review the imaging study She is not symptomatic I told him the implication of the  findings They understood that it could represent metastatic disease That needs to be observed with serial imaging study after she completes radiation treatment  Cancer associated pain She has mild pain after surgery I would defer to her radiation oncologist for pain management   No orders of the defined types were placed in this encounter.  All questions were answered. The patient knows to call the clinic with any problems, questions or concerns. No barriers to learning was detected. I spent 25 minutes counseling the patient face to face. The total time spent in the appointment was 30 minutes and more than 50% was on counseling and review of test results     Heath Lark, MD 03/23/2017 7:23 PM

## 2017-03-24 NOTE — Progress Notes (Signed)
error 

## 2017-03-26 ENCOUNTER — Encounter: Payer: Self-pay | Admitting: *Deleted

## 2017-03-26 ENCOUNTER — Ambulatory Visit
Admission: RE | Admit: 2017-03-26 | Discharge: 2017-03-26 | Disposition: A | Discharge status: Home / Self Care | Payer: Medicare Other | Source: Ambulatory Visit | Attending: Radiation Oncology | Admitting: Radiation Oncology

## 2017-03-26 VITALS — BP 104/72 | HR 83 | Temp 98.5°F

## 2017-03-26 DIAGNOSIS — G35 Multiple sclerosis: Secondary | ICD-10-CM | POA: Insufficient documentation

## 2017-03-26 DIAGNOSIS — Z510 Encounter for antineoplastic radiation therapy: Secondary | ICD-10-CM | POA: Insufficient documentation

## 2017-03-26 DIAGNOSIS — C029 Malignant neoplasm of tongue, unspecified: Secondary | ICD-10-CM | POA: Insufficient documentation

## 2017-03-26 DIAGNOSIS — Z90710 Acquired absence of both cervix and uterus: Secondary | ICD-10-CM | POA: Insufficient documentation

## 2017-03-26 DIAGNOSIS — Z87891 Personal history of nicotine dependence: Secondary | ICD-10-CM | POA: Insufficient documentation

## 2017-03-26 DIAGNOSIS — Z882 Allergy status to sulfonamides status: Secondary | ICD-10-CM | POA: Insufficient documentation

## 2017-03-26 DIAGNOSIS — Z79899 Other long term (current) drug therapy: Secondary | ICD-10-CM | POA: Insufficient documentation

## 2017-03-26 DIAGNOSIS — R42 Dizziness and giddiness: Secondary | ICD-10-CM | POA: Insufficient documentation

## 2017-03-26 DIAGNOSIS — Z888 Allergy status to other drugs, medicaments and biological substances status: Secondary | ICD-10-CM | POA: Insufficient documentation

## 2017-03-26 DIAGNOSIS — C023 Malignant neoplasm of anterior two-thirds of tongue, part unspecified: Secondary | ICD-10-CM | POA: Insufficient documentation

## 2017-03-26 DIAGNOSIS — I10 Essential (primary) hypertension: Secondary | ICD-10-CM | POA: Insufficient documentation

## 2017-03-26 DIAGNOSIS — G44201 Tension-type headache, unspecified, intractable: Secondary | ICD-10-CM | POA: Insufficient documentation

## 2017-03-26 DIAGNOSIS — Z9889 Other specified postprocedural states: Secondary | ICD-10-CM | POA: Insufficient documentation

## 2017-03-26 MED ORDER — SODIUM CHLORIDE 0.9% FLUSH
10.0000 mL | Freq: Once | INTRAVENOUS | Status: AC
  Administered 2017-03-26: 10 mL via INTRAVENOUS

## 2017-03-26 NOTE — Progress Notes (Signed)
Head and Neck Cancer Simulation, IMRT treatment planning note   Outpatient  Diagnosis:    ICD-9-CM ICD-10-CM   1. Cancer of anterior two-thirds of tongue ([facility]) 141.4 C02.3     The patient was taken to the CT simulator and laid in the supine position on the table. An Aquaplast head and shoulder mask was custom fitted to the patient's anatomy. High-resolution CT axial imaging was obtained of the head and neck with contrast. I verified that the quality of the imaging is good for treatment planning. 1 Medically Necessary Treatment Device was fabricated and supervised by me: Aquaplast mask.   Treatment planning note I plan to treat the patient with IMRT. I plan to treat the patient's tumor bed and bilateral neck nodes. I plan to treat to a total dose of 66 Gray in 33  fractions. Dose calculation was ordered from dosimetry.  IMRT planning Note  IMRT is medically necessary and an important modality to deliver adequate dose to the patient's at risk tissues while sparing the patient's normal structures, including the: esophagus, parotid tissue, mandible, brain stem, spinal cord, oral cavity, brachial plexus.  This justifies the use of IMRT in the patient's treatment.   -----------------------------------  Eppie Gibson, MD

## 2017-03-27 NOTE — Progress Notes (Signed)
A user error has taken place: encounter opened in error, closed for administrative reasons.

## 2017-03-27 NOTE — Progress Notes (Signed)
Oncology Nurse Navigator Documentation  To provide support, encouragement and care continuity, met with Sabrina Romero during CT SIM.  She was accompanied by her husband. SIM was unsuccessful this past Monday, 4/30, d/t copious secretions which made supine position over period of time difficult. She arrived today having started Scopolamine 4/30; secretions somewhat reduced but still notably present.  Suction provided during procedure. She completed SIM though with difficulty during final minutes d/t secretions. I suggested opthalmic atropine with Dr. Isidore Moos as additional option for secretion control. They understand I can be contacted with needs/concerns.  [name], RN, BSN, [place] Neck Oncology Nurse [place] at [place] [phone]

## 2017-03-31 ENCOUNTER — Ambulatory Visit: Payer: Medicare Other | Admitting: Radiation Oncology

## 2017-04-01 ENCOUNTER — Ambulatory Visit: Payer: Medicare Other

## 2017-04-02 ENCOUNTER — Telehealth: Payer: Self-pay | Admitting: *Deleted

## 2017-04-02 ENCOUNTER — Other Ambulatory Visit: Payer: Self-pay | Admitting: Radiation Oncology

## 2017-04-02 ENCOUNTER — Ambulatory Visit: Payer: Medicare Other

## 2017-04-02 DIAGNOSIS — C023 Malignant neoplasm of anterior two-thirds of tongue, part unspecified: Secondary | ICD-10-CM

## 2017-04-02 MED ORDER — IPRATROPIUM BROMIDE 0.03 % NA SOLN: NASAL | 2 refills | Status: AC

## 2017-04-02 NOTE — Telephone Encounter (Signed)
Oncology Nurse Navigator Documentation  Spoke with Sabrina Romero who requested Rx for ipratropium 0.03% nasal spray be called in to [facility] [phone]).  Called [facility], spoke with Rush Landmark, provided verbal order per Dr. Pearlie Oyster instructions in Epic for Rx.    [name], RN, BSN, [place] Neck Oncology Nurse [place] at [place] [phone]

## 2017-04-05 ENCOUNTER — Ambulatory Visit: Payer: Medicare Other

## 2017-04-05 DIAGNOSIS — C023 Malignant neoplasm of anterior two-thirds of tongue, part unspecified: Secondary | ICD-10-CM | POA: Diagnosis not present

## 2017-04-05 DIAGNOSIS — Z882 Allergy status to sulfonamides status: Secondary | ICD-10-CM | POA: Diagnosis not present

## 2017-04-05 DIAGNOSIS — R42 Dizziness and giddiness: Secondary | ICD-10-CM | POA: Diagnosis not present

## 2017-04-05 DIAGNOSIS — Z888 Allergy status to other drugs, medicaments and biological substances status: Secondary | ICD-10-CM | POA: Diagnosis not present

## 2017-04-05 DIAGNOSIS — C029 Malignant neoplasm of tongue, unspecified: Secondary | ICD-10-CM | POA: Diagnosis not present

## 2017-04-05 DIAGNOSIS — Z510 Encounter for antineoplastic radiation therapy: Secondary | ICD-10-CM | POA: Diagnosis present

## 2017-04-05 DIAGNOSIS — Z87891 Personal history of nicotine dependence: Secondary | ICD-10-CM | POA: Diagnosis not present

## 2017-04-05 DIAGNOSIS — Z9889 Other specified postprocedural states: Secondary | ICD-10-CM | POA: Diagnosis not present

## 2017-04-05 DIAGNOSIS — G35 Multiple sclerosis: Secondary | ICD-10-CM | POA: Diagnosis not present

## 2017-04-05 DIAGNOSIS — G44201 Tension-type headache, unspecified, intractable: Secondary | ICD-10-CM | POA: Diagnosis not present

## 2017-04-05 DIAGNOSIS — Z90710 Acquired absence of both cervix and uterus: Secondary | ICD-10-CM | POA: Diagnosis not present

## 2017-04-05 DIAGNOSIS — I10 Essential (primary) hypertension: Secondary | ICD-10-CM | POA: Diagnosis not present

## 2017-04-05 DIAGNOSIS — Z79899 Other long term (current) drug therapy: Secondary | ICD-10-CM | POA: Diagnosis not present

## 2017-04-06 ENCOUNTER — Ambulatory Visit: Payer: Medicare Other

## 2017-04-07 ENCOUNTER — Ambulatory Visit
Admission: RE | Admit: 2017-04-07 | Discharge: 2017-04-07 | Disposition: A | Discharge status: Home / Self Care | Payer: Medicare Other | Source: Ambulatory Visit | Attending: Radiation Oncology | Admitting: Radiation Oncology

## 2017-04-07 ENCOUNTER — Encounter: Payer: Self-pay | Admitting: Radiation Oncology

## 2017-04-07 ENCOUNTER — Other Ambulatory Visit: Payer: Self-pay | Admitting: Radiation Oncology

## 2017-04-07 ENCOUNTER — Ambulatory Visit: Payer: Medicare Other

## 2017-04-07 DIAGNOSIS — C023 Malignant neoplasm of anterior two-thirds of tongue, part unspecified: Secondary | ICD-10-CM

## 2017-04-07 DIAGNOSIS — Z510 Encounter for antineoplastic radiation therapy: Secondary | ICD-10-CM | POA: Diagnosis not present

## 2017-04-07 MED ORDER — BIAFINE EX EMUL
CUTANEOUS | Status: DC | PRN
  Administered 2017-04-07: 16:00:00 via TOPICAL

## 2017-04-07 MED ORDER — ATROPINE SULFATE 1 % OP SOLN: OPHTHALMIC | 5 refills | Status: AC

## 2017-04-07 NOTE — Progress Notes (Signed)

## 2017-04-07 NOTE — Progress Notes (Addendum)
Patient unable to complete first fraction of radiotherapy today due to copious secretions in mouth.  Therefore, I will Rx atropine SL drops, and patient has been instructed to stop ipratropium. Hopefully atropine (no more than 2 drops, daily, 39min before RT) will help her excessive saliva prior to radiotherapy.  As explained to the patient, I tried other agents before atropine due to atropine risks (cardiac, CNS risks). At this point, I think benefits of atropine outweigh risks, as patient cannot tolerate RT without improvement of hypersalivation.  Rx choice discussed with pharmacist on staff.  -----------------------------------  Eppie Gibson, MD

## 2017-04-08 ENCOUNTER — Ambulatory Visit
Admission: RE | Admit: 2017-04-08 | Discharge: 2017-04-08 | Disposition: A | Discharge status: Home / Self Care | Payer: Medicare Other | Source: Ambulatory Visit | Attending: Radiation Oncology | Admitting: Radiation Oncology

## 2017-04-08 ENCOUNTER — Encounter: Payer: Self-pay | Admitting: *Deleted

## 2017-04-08 DIAGNOSIS — Z510 Encounter for antineoplastic radiation therapy: Secondary | ICD-10-CM | POA: Diagnosis not present

## 2017-04-09 ENCOUNTER — Encounter: Payer: Self-pay | Admitting: *Deleted

## 2017-04-09 ENCOUNTER — Ambulatory Visit
Admission: RE | Admit: 2017-04-09 | Discharge: 2017-04-09 | Disposition: A | Discharge status: Home / Self Care | Payer: Medicare Other | Source: Ambulatory Visit | Attending: Radiation Oncology | Admitting: Radiation Oncology

## 2017-04-09 DIAGNOSIS — Z510 Encounter for antineoplastic radiation therapy: Secondary | ICD-10-CM | POA: Diagnosis not present

## 2017-04-09 NOTE — Progress Notes (Signed)
Oncology Nurse Navigator Documentation  Met with Sabrina Romero during RT to provide support, encouragement and to assist with PRN suctioning. She stated she had applied 2 drops atropine SL prior to my arrival. She self-suctioned prior to and after tmt. She tolerated treatment without interruption. I noted secretions seemed less than yesterday.  She stated agreement. She understands either RN Anderson Malta or I will be available during future RT as needed.  [name], RN, BSN, [place] Neck Oncology Nurse [place] at [place] [phone]

## 2017-04-09 NOTE — Progress Notes (Signed)
Oncology Nurse Navigator Documentation  To provide support, encouragement and assistance with secretion mgt, met with Sabrina Romero during RT.  She was accompanied by her husband. She administered 2 drops atropine 1 % ophthalmic solution approximately 15 mins prior to tmt as prescribed by Dr. Isidore Moos. She was producing copious amounts of clear secretions, used suction device immediately prior to and after tmt.  She successfully completed treatment.  [name], RN, BSN, [place] Neck Oncology Nurse [place] at [place] [phone]

## 2017-04-11 ENCOUNTER — Ambulatory Visit: Payer: Medicare Other

## 2017-04-12 ENCOUNTER — Ambulatory Visit
Admission: RE | Admit: 2017-04-12 | Discharge: 2017-04-12 | Disposition: A | Discharge status: Home / Self Care | Payer: Medicare Other | Source: Ambulatory Visit | Attending: Radiation Oncology | Admitting: Radiation Oncology

## 2017-04-12 ENCOUNTER — Encounter: Payer: Self-pay | Admitting: Radiation Oncology

## 2017-04-12 ENCOUNTER — Encounter: Payer: Self-pay | Admitting: *Deleted

## 2017-04-12 VITALS — BP 105/73 | HR 89 | Temp 97.7°F | Ht 64.0 in | Wt 174.8 lb

## 2017-04-12 DIAGNOSIS — C023 Malignant neoplasm of anterior two-thirds of tongue, part unspecified: Secondary | ICD-10-CM

## 2017-04-12 DIAGNOSIS — Z510 Encounter for antineoplastic radiation therapy: Secondary | ICD-10-CM | POA: Diagnosis not present

## 2017-04-12 NOTE — Progress Notes (Signed)
Sabrina Romero presents for her 3rd fraction of radiation to her Tongue. She reports a chronic headache which has improved slightly. She is drinking water orally, and eating yogurt, soup, pudding once daily. She is instilling 3 cans daily of osmolite through her feeding tube. The skin to her neck is intact. She will begin using biafine twice daily.   BP 105/73   Pulse 89   Temp 97.7 F (36.5 C)   Ht 5\' 4"  (1.626 m)   Wt 174 lb 12.8 oz (79.3 kg)   SpO2 100% Comment: room air  BMI 30.00 kg/m    Wt Readings from Last 3 Encounters:  04/12/17 174 lb 12.8 oz (79.3 kg)  12/29/16 184 lb 3.2 oz (83.6 kg)  12/29/16 184 lb 4.8 oz (83.6 kg)

## 2017-04-12 NOTE — Progress Notes (Signed)
   Weekly Management Note:  Outpatient    ICD-9-CM ICD-10-CM   1. Cancer of anterior two-thirds of tongue ([facility]) 141.4 C02.3     Current Dose:  6 Gy  Projected Dose: 66 Gy   Narrative:  The patient presents for routine under treatment assessment.  CBCT/MVCT images/Port film x-rays were reviewed.  The chart was checked. Feels better, secretions improved with scopolamine and atropine.  She was cleared for RT by Dr Vicie Mutters at the end of April, graft satisfactory.   Physical Findings:  Wt Readings from Last 3 Encounters:  04/12/17 174 lb 12.8 oz (79.3 kg)  12/29/16 184 lb 3.2 oz (83.6 kg)  12/29/16 184 lb 4.8 oz (83.6 kg)    height is 5\' 4"  (1.626 m) and weight is 174 lb 12.8 oz (79.3 kg). Her temperature is 97.7 F (36.5 C). Her blood pressure is 105/73 and her pulse is 89. Her oxygen saturation is 100%.  tannish film scraped from tongue surface with tongue depressor. No thrush, no foul odor.  Saliva less copious today than last week.  CBC    Component Value Date/Time   WBC 6.3 03/18/2017 1003   RBC 3.81 03/18/2017 1003   HGB 11.9 03/18/2017 1003   HCT 35.9 03/18/2017 1003   PLT 287 03/18/2017 1003   MCV 94.2 03/18/2017 1003   MCH 31.2 03/18/2017 1003   MCHC 33.1 03/18/2017 1003   RDW 13.6 03/18/2017 1003   LYMPHSABS 2.1 03/18/2017 1003   MONOABS 0.5 03/18/2017 1003   EOSABS 0.3 03/18/2017 1003   BASOSABS 0.0 03/18/2017 1003     CMP     Component Value Date/Time   NA 137 03/18/2017 1003   K 4.7 03/18/2017 1003   CO2 28 03/18/2017 1003   GLUCOSE 93 03/18/2017 1003   BUN 14.4 03/18/2017 1003   CREATININE 0.8 03/18/2017 1003   CALCIUM 10.2 03/18/2017 1003   PROT 7.2 03/18/2017 1003   ALBUMIN 3.8 03/18/2017 1003   AST 16 03/18/2017 1003   ALT 25 03/18/2017 1003   ALKPHOS 133 03/18/2017 1003   BILITOT 0.37 03/18/2017 1003     Impression:  The patient is tolerating radiotherapy.   Plan:  Continue radiotherapy as planned. Discussed baking soda gargles  today.  -----------------------------------  Eppie Gibson, MD

## 2017-04-13 ENCOUNTER — Ambulatory Visit
Admission: RE | Admit: 2017-04-13 | Discharge: 2017-04-13 | Disposition: A | Discharge status: Home / Self Care | Payer: Medicare Other | Source: Ambulatory Visit | Attending: Radiation Oncology | Admitting: Radiation Oncology

## 2017-04-13 DIAGNOSIS — Z510 Encounter for antineoplastic radiation therapy: Secondary | ICD-10-CM | POA: Diagnosis not present

## 2017-04-13 NOTE — Progress Notes (Signed)
Oncology Nurse Navigator Documentation  Met with Ms. Ivan during RT to provide support, encouragement and to assist with PRN suctioning.  She was accompanied by her husband. She stated she had applied 2 drops atropine SL upon arrival to the dressing room. She self-suctioned prior to and after tmt. She tolerated treatment without interruption. I noted secretions seemed less than last Friday.  She stated secretions are thicker now. She understands either RN Anderson Malta or I will be available during future RT as needed.  [name], RN, BSN, [place] Neck Oncology Nurse [place] at [place] [phone]

## 2017-04-14 ENCOUNTER — Encounter: Payer: Self-pay | Admitting: *Deleted

## 2017-04-14 ENCOUNTER — Ambulatory Visit
Admission: RE | Admit: 2017-04-14 | Discharge: 2017-04-14 | Disposition: A | Discharge status: Home / Self Care | Payer: Medicare Other | Source: Ambulatory Visit | Attending: Radiation Oncology | Admitting: Radiation Oncology

## 2017-04-14 DIAGNOSIS — Z510 Encounter for antineoplastic radiation therapy: Secondary | ICD-10-CM | POA: Diagnosis not present

## 2017-04-14 NOTE — Progress Notes (Signed)
Oncology Nurse Navigator Documentation  Met with Ms. Sandoval during RT to provide support, encouragement and to assist with PRN suctioning.  She was accompanied by her husband. She stated she had applied 2 drops atropine SL upon arrival to the dressing room.  I encouraged application when she arrives to Radiation Waiting to enhance efficacy. She self-suctioned prior to and after tmt. She tolerated treatment without interruption.  [name], RN, BSN, [place] [facility] Oncology Nurse [place] at [place] [phone]

## 2017-04-15 ENCOUNTER — Ambulatory Visit
Admission: RE | Admit: 2017-04-15 | Discharge: 2017-04-15 | Disposition: A | Discharge status: Home / Self Care | Payer: Medicare Other | Source: Ambulatory Visit | Attending: Radiation Oncology | Admitting: Radiation Oncology

## 2017-04-15 DIAGNOSIS — Z510 Encounter for antineoplastic radiation therapy: Secondary | ICD-10-CM | POA: Diagnosis not present

## 2017-04-16 ENCOUNTER — Ambulatory Visit
Admission: RE | Admit: 2017-04-16 | Discharge: 2017-04-16 | Disposition: A | Discharge status: Home / Self Care | Payer: Medicare Other | Source: Ambulatory Visit | Attending: Radiation Oncology | Admitting: Radiation Oncology

## 2017-04-16 ENCOUNTER — Encounter: Payer: Self-pay | Admitting: Radiation Oncology

## 2017-04-16 VITALS — BP 124/74 | HR 92 | Temp 97.7°F

## 2017-04-16 DIAGNOSIS — G44201 Tension-type headache, unspecified, intractable: Secondary | ICD-10-CM

## 2017-04-16 DIAGNOSIS — C023 Malignant neoplasm of anterior two-thirds of tongue, part unspecified: Secondary | ICD-10-CM

## 2017-04-16 DIAGNOSIS — Z510 Encounter for antineoplastic radiation therapy: Secondary | ICD-10-CM | POA: Diagnosis not present

## 2017-04-16 MED ORDER — OXYCODONE HCL 5 MG/5ML PO SOLN: 10.0000 mg | ORAL | 0 refills | Status: AC | PRN

## 2017-04-16 NOTE — Progress Notes (Signed)
Sabrina Romero presents for an unscheduled visit today. She reports pain to her head. She describes a pounding to the Left side of her head which she rates a 10/10, and causes nausea at times. She has tried ibuprofen without relief. She tells me that she does not have any other pain medicine at home at this time. She tells me that she is able to use her feeding tube well. She has no other concerns at this time.   BP 124/74   Pulse 92   Temp 97.7 F (36.5 C)   SpO2 100% Comment: room air

## 2017-04-16 NOTE — Progress Notes (Signed)
Weekly Management Note:  Outpatient    ICD-9-CM ICD-10-CM   1. 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 Acute intractable tension-type headache 339.10 G44.201 CANCELED: CT Head W Wo Contrast     CANCELED: CT Soft Tissue Neck W Contrast    Current Dose:  14 Gy  Projected Dose: 66 Gy   Narrative:  The patient presents unscheduled for under treatment assessment. She is accompanied by her husband today. The chart was checked.  The patient complains of recent onset pain to her head, and describes a "pounding" sensation to the left side of her head as well as a "deep muscle soreness" which she rates 10/10 in severity. She does not appear to be in excruciated pain at present, but subjectively the pain is severe. This extends from the neck to the left side of her head, behind her left ear. This occasionally causes nausea. She denies any recent vision changes. She reports she has tried Ibuprofen without relief, and does not have access to any other pain medicine. She also reports difficulty using her feeding tube. She denies any other concerns at this time.  Physical Findings:  temperature is 97.7 F (36.5 C). Her blood pressure is 124/74 and her pulse is 92. Her oxygen saturation is 100%.   Wt Readings from Last 3 Encounters:  04/12/17 174 lb 12.8 oz (79.3 kg)  12/29/16 184 lb 3.2 oz (83.6 kg)  12/29/16 184 lb 4.8 oz (83.6 kg)  Patient presents in a wheelchair. No changes or signs of progression along the left neck. Oral cavity shows less copious secretions than previously with no signs of thrush. Abdomen has PEG tube in place which does not appear to be infected, but she has some scant bleeding and oozing of what appears to be stomach acid from the site.  Impression:  The patient is  tolerating radiotherapy. Recent onset of severe left sided neck/headache.  Plan:  Continue radiotherapy as planned. Patient will try to use 1 drop of Atropine this weekend to control thick secretions, rather than 2 drops. If this is adequate, she may continue this in the future. I will refill oxycodone for pain every 4 hours as needed; per patient's request I will order this in liquid form for use through PEG tube.  As discussed with Dr Jola Baptist of radiology, I will order a head CT neck and head angiogram blood clot, bleed, or other concern that could be causing the patient's significant head pain. 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 I asked for this ASAP.  I encouraged the patient to clean and cover her PEG tube with gauze; this should help reduce the patient's skin irritation. Nursing instructed the patient on dressing this area today.  I will follow up with the patient again in a few days after the weekend.  I spent 20 minutes face to face with the patient and more than 50% of that time was spent in counseling and/or coordination of care. ________________________________   Eppie Gibson, M.D.  This document serves as a record of services personally performed by Eppie Gibson, MD. It was created on her behalf by Maryla Morrow, a trained medical scribe. The creation  of this record is based on the scribe's personal observations and the provider's statements to them. This document has been checked and approved by the attending provider.

## 2017-04-17 ENCOUNTER — Ambulatory Visit: Payer: Medicare Other

## 2017-04-18 ENCOUNTER — Telehealth: Payer: Self-pay | Admitting: Radiation Oncology

## 2017-04-18 ENCOUNTER — Ambulatory Visit: Payer: Medicare Other

## 2017-04-18 NOTE — Telephone Encounter (Signed)
Spoke with husband by phone, after I called to check on how Sabrina Romero is doing.  He states she is feeling better.  This is reassuring.  Scans are scheduled for 04-20-17 - our scheduler asked for ASAP and this is the soonest possible, after Memorial weekend.  I explained to him that the rationale for the scans is to rule out something serious, ie vessel damage from the tumor as the underlying cause of her pain.    We talked about my recommendation that she go to the ED at any time if her symptoms worsen.   He thanked me for the call and stated his understanding.  -----------------------------------  Sabrina Gibson, MD

## 2017-04-20 ENCOUNTER — Ambulatory Visit (HOSPITAL_COMMUNITY)
Admission: RE | Admit: 2017-04-20 | Discharge: 2017-04-20 | Disposition: A | Discharge status: Home / Self Care | Payer: Medicare Other | Source: Ambulatory Visit | Attending: Radiation Oncology | Admitting: Radiation Oncology

## 2017-04-20 ENCOUNTER — Ambulatory Visit
Admission: RE | Admit: 2017-04-20 | Discharge: 2017-04-20 | Disposition: A | Discharge status: Home / Self Care | Payer: Medicare Other | Source: Ambulatory Visit | Attending: Radiation Oncology | Admitting: Radiation Oncology

## 2017-04-20 ENCOUNTER — Ambulatory Visit: Admission: RE | Admit: 2017-04-20 | Payer: Medicare Other | Source: Ambulatory Visit | Admitting: Radiation Oncology

## 2017-04-20 ENCOUNTER — Encounter: Payer: Self-pay | Admitting: Radiation Oncology

## 2017-04-20 VITALS — BP 113/76 | HR 103 | Temp 98.8°F | Wt 174.2 lb

## 2017-04-20 DIAGNOSIS — R9089 Other abnormal findings on diagnostic imaging of central nervous system: Secondary | ICD-10-CM | POA: Diagnosis not present

## 2017-04-20 DIAGNOSIS — Z510 Encounter for antineoplastic radiation therapy: Secondary | ICD-10-CM | POA: Diagnosis not present

## 2017-04-20 DIAGNOSIS — C023 Malignant neoplasm of anterior two-thirds of tongue, part unspecified: Secondary | ICD-10-CM | POA: Insufficient documentation

## 2017-04-20 DIAGNOSIS — I672 Cerebral atherosclerosis: Secondary | ICD-10-CM | POA: Diagnosis not present

## 2017-04-20 MED ORDER — IOPAMIDOL (ISOVUE-370) INJECTION 76%
INTRAVENOUS | Status: AC
  Filled 2017-04-20: qty 100

## 2017-04-20 MED ORDER — IOPAMIDOL (ISOVUE-370) INJECTION 76%
100.0000 mL | Freq: Once | INTRAVENOUS | Status: AC | PRN
  Administered 2017-04-20: 100 mL via INTRAVENOUS

## 2017-04-20 MED FILL — TRANSDERM-SCOP 1.5 MG/3 DAY: 1 | 30 days supply | Qty: 10 | Fill #1

## 2017-04-20 NOTE — Progress Notes (Signed)
Late entry from 04/16/2017 at 1205. Per Dr. Pearlie Oyster order provided patient with split 4x4 gauze for PEG. Instructed patient upon use. Patient verbalized understanding.

## 2017-04-20 NOTE — Progress Notes (Signed)
   Weekly Management Note:  Outpatient    ICD-9-CM ICD-10-CM   1. 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 Sabrina Romero is accompanied by her husband today. The chart was checked.  The patient presents today for her 8th fraction of radiation. Sabrina Romero continues to report pain to the left side of her neck/head, though oxycodone helps alleviate the pain. Sabrina Romero is feeling somewhat better than last week. Sabrina Romero is using Biafine twice a day as directed. The patient reports Sabrina Romero is drinking water orally. Sabrina Romero is instilling 3 cans of Osmolite daily as well as water. Sabrina Romero reports some thick saliva.  The patient will present for head CT angio+neck today, as ordered.  Physical Findings:  weight is 174 lb 3.2 oz (79 kg). Her temperature is 98.8 F (37.1 C). Her blood pressure is 113/76 and her pulse is 103 (abnormal). Her oxygen saturation is 100%.   Wt Readings from Last 3 Encounters:  04/20/17 174 lb 3.2 oz (79 kg)  04/12/17 174 lb 12.8 oz (79.3 kg)  12/29/16 184 lb 3.2 oz (83.6 kg)  Patient presents in a wheelchair. NAD Sabrina Romero has copious saliva in the mouth and expectant mucositis over the tongue. No oral thrush.  Impression:  The patient is tolerating radiotherapy. Recent onset of severe left sided neck/headache, controlled with oxycodone.  Plan:  Continue radiotherapy as planned.   The patient will proceed with CT angiogram today. Patient may use Atropine prior to the scan as needed, though I encouraged her to try the scan without it.  ______________________________   Eppie Gibson, M.D.  This document serves as a record of services personally performed by Eppie Gibson, MD. It was created on her behalf by Maryla Morrow, a trained medical scribe. The creation of this record is based on the scribe's personal observations and the provider's statements to them. This document has been  checked and approved by the attending provider.

## 2017-04-20 NOTE — Addendum Note (Signed)
Encounter addended by: Heywood Footman, RN on: 04/20/2017 11:29 AM<BR>    Actions taken: Sign clinical note

## 2017-04-20 NOTE — Progress Notes (Signed)
Sabrina Romero presents for her 8th fraction of radiation. She continues to report pain to the left side of her head. She tells me that oxycodone helps this pain. It is somewhat better than last Friday. The skin to her radiation site is intact and she is using biafine as directed twice daily. She is drinking water orally, and had tomato soup today. She is instilling 3 cans of osmolite daily and free water. She has some thick saliva. She will go for CT scan of head today.  BP 113/76   Pulse (!) 103   Temp 98.8 F (37.1 C)   Wt 174 lb 3.2 oz (79 kg)   SpO2 100% Comment: room air  BMI 29.90 kg/m    Wt Readings from Last 3 Encounters:  04/20/17 174 lb 3.2 oz (79 kg)  04/12/17 174 lb 12.8 oz (79.3 kg)  12/29/16 184 lb 3.2 oz (83.6 kg)

## 2017-04-21 ENCOUNTER — Ambulatory Visit
Admission: RE | Admit: 2017-04-21 | Discharge: 2017-04-21 | Disposition: A | Discharge status: Home / Self Care | Payer: Medicare Other | Source: Ambulatory Visit | Attending: Radiation Oncology | Admitting: Radiation Oncology

## 2017-04-21 DIAGNOSIS — Z510 Encounter for antineoplastic radiation therapy: Secondary | ICD-10-CM | POA: Diagnosis not present

## 2017-04-22 ENCOUNTER — Ambulatory Visit
Admission: RE | Admit: 2017-04-22 | Discharge: 2017-04-22 | Disposition: A | Discharge status: Home / Self Care | Payer: Medicare Other | Source: Ambulatory Visit | Attending: Radiation Oncology | Admitting: Radiation Oncology

## 2017-04-22 ENCOUNTER — Ambulatory Visit (HOSPITAL_COMMUNITY): Payer: Medicare Other

## 2017-04-22 DIAGNOSIS — Z510 Encounter for antineoplastic radiation therapy: Secondary | ICD-10-CM | POA: Diagnosis not present

## 2017-04-23 ENCOUNTER — Ambulatory Visit
Admission: RE | Admit: 2017-04-23 | Discharge: 2017-04-23 | Disposition: A | Discharge status: Home / Self Care | Payer: Medicare Other | Source: Ambulatory Visit | Attending: Radiation Oncology | Admitting: Radiation Oncology

## 2017-04-23 DIAGNOSIS — Z510 Encounter for antineoplastic radiation therapy: Secondary | ICD-10-CM | POA: Diagnosis not present

## 2017-04-24 ENCOUNTER — Ambulatory Visit: Payer: Medicare Other

## 2017-04-25 ENCOUNTER — Ambulatory Visit: Payer: Medicare Other

## 2017-04-26 ENCOUNTER — Encounter: Payer: Self-pay | Admitting: *Deleted

## 2017-04-26 ENCOUNTER — Encounter: Payer: Self-pay | Admitting: Radiation Oncology

## 2017-04-26 ENCOUNTER — Ambulatory Visit
Admission: RE | Admit: 2017-04-26 | Discharge: 2017-04-26 | Disposition: A | Discharge status: Home / Self Care | Payer: Medicare Other | Source: Ambulatory Visit | Attending: Radiation Oncology | Admitting: Radiation Oncology

## 2017-04-26 ENCOUNTER — Other Ambulatory Visit: Payer: Self-pay | Admitting: Radiation Oncology

## 2017-04-26 VITALS — BP 100/71 | HR 101 | Temp 97.9°F | Ht 64.0 in | Wt 170.8 lb

## 2017-04-26 DIAGNOSIS — C023 Malignant neoplasm of anterior two-thirds of tongue, part unspecified: Secondary | ICD-10-CM

## 2017-04-26 DIAGNOSIS — Z510 Encounter for antineoplastic radiation therapy: Secondary | ICD-10-CM | POA: Diagnosis not present

## 2017-04-26 LAB — COMPREHENSIVE METABOLIC PANEL
ALK PHOS: 132 U/L (ref 40–150)
ALT: 71 U/L — ABNORMAL HIGH (ref 0–55)
AST: 51 U/L — AB (ref 5–34)
Albumin: 3.6 g/dL (ref 3.5–5.0)
Anion Gap: 8 mEq/L (ref 3–11)
BUN: 16.5 mg/dL (ref 7.0–26.0)
CHLORIDE: 98 meq/L (ref 98–109)
CO2: 27 mEq/L (ref 22–29)
Calcium: 10.2 mg/dL (ref 8.4–10.4)
Creatinine: 0.8 mg/dL (ref 0.6–1.1)
EGFR: 79 mL/min/{1.73_m2} — AB (ref >90)
GLUCOSE: 109 mg/dL (ref 70–140)
POTASSIUM: 5 meq/L (ref 3.5–5.1)
SODIUM: 133 meq/L — AB (ref 136–145)
Total Bilirubin: 0.53 mg/dL (ref 0.20–1.20)
Total Protein: 6.9 g/dL (ref 6.4–8.3)

## 2017-04-26 LAB — CBC WITH DIFFERENTIAL/PLATELET
BASO%: 0.3 % (ref 0.0–2.0)
Basophils Absolute: 0 10*3/uL (ref 0.0–0.1)
EOS ABS: 0.1 10*3/uL (ref 0.0–0.5)
EOS%: 0.7 % (ref 0.0–7.0)
HEMATOCRIT: 34.9 % (ref 34.8–46.6)
HGB: 11.8 g/dL (ref 11.6–15.9)
LYMPH#: 0.7 10*3/uL — AB (ref 0.9–3.3)
LYMPH%: 7.8 % — ABNORMAL LOW (ref 14.0–49.7)
MCH: 31.2 pg (ref 25.1–34.0)
MCHC: 33.8 g/dL (ref 31.5–36.0)
MCV: 92.2 fL (ref 79.5–101.0)
MONO#: 0.7 10*3/uL (ref 0.1–0.9)
MONO%: 7.6 % (ref 0.0–14.0)
NEUT%: 83.6 % — AB (ref 38.4–76.8)
NEUTROS ABS: 7.5 10*3/uL — AB (ref 1.5–6.5)
PLATELETS: 260 10*3/uL (ref 145–400)
RBC: 3.78 10*6/uL (ref 3.70–5.45)
RDW: 13.5 % (ref 11.2–14.5)
WBC: 9 10*3/uL (ref 3.9–10.3)

## 2017-04-26 MED ORDER — LORAZEPAM 2 MG/ML PO CONC: 0.6000 mg | Freq: Three times a day (TID) | ORAL | 0 refills | Status: AC | PRN

## 2017-04-26 MED FILL — LORazepam 2 MG/ML CONC: 2 | 33 days supply | Qty: 30 | Fill #0

## 2017-04-26 NOTE — Progress Notes (Signed)
   Weekly Management Note:  Outpatient    ICD-9-CM ICD-10-CM   1. 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 She is accompanied by her husband today. The chart was checked. CT angio+neck did not show acute issues. She complaints today of fatigue and nausea.  Denies vomiting.  Has episodes that have occurs for years, in which she feels dizzy and hot/sweaty.  They are lasting longer than usual.  Feels like passing out during these episodes.  PO intake decreased. In WC, can't stand for orthostatic BP.  Physical Findings:  height is 5\' 4"  (1.626 m) and weight is 170 lb 12.8 oz (77.5 kg). Her temperature is 97.9 F (36.6 C). Her blood pressure is 100/71 and her pulse is 101 (abnormal).   Wt Readings from Last 3 Encounters:  04/26/17 170 lb 12.8 oz (77.5 kg)  04/20/17 174 lb 3.2 oz (79 kg)  04/12/17 174 lb 12.8 oz (79.3 kg)  Patient presents in a wheelchair. NAD She has copious saliva in the mouth and mucositis over the tongue. No oral thrush. Skin intact over neck  CBC    Component Value Date/Time   WBC 9.0 04/26/2017 1051   RBC 3.78 04/26/2017 1051   HGB 11.8 04/26/2017 1051   HCT 34.9 04/26/2017 1051   PLT 260 04/26/2017 1051   MCV 92.2 04/26/2017 1051   MCH 31.2 04/26/2017 1051   MCHC 33.8 04/26/2017 1051   RDW 13.5 04/26/2017 1051   LYMPHSABS 0.7 (L) 04/26/2017 1051   MONOABS 0.7 04/26/2017 1051   EOSABS 0.1 04/26/2017 1051   BASOSABS 0.0 04/26/2017 1051   CMP     Component Value Date/Time   NA 133 (L) 04/26/2017 1051   K 5.0 04/26/2017 1051   CO2 27 04/26/2017 1051   GLUCOSE 109 04/26/2017 1051   BUN 16.5 04/26/2017 1051   CREATININE 0.8 04/26/2017 1051   CALCIUM 10.2 04/26/2017 1051   PROT 6.9 04/26/2017 1051   ALBUMIN 3.6 04/26/2017 1051   AST 51 (H) 04/26/2017 1051   ALT 71 (H) 04/26/2017 1051   ALKPHOS 132 04/26/2017 1051   BILITOT  0.53 04/26/2017 1051     Impression:  The patient is tolerating radiotherapy. Pain controlled with oxycodone. See sx above - vasovagal in nature?? Chronic but worsening.  Plan:  Continue radiotherapy as planned.  Refer back to med/onc for symptom management. She has multiple comorbidities and is relatively fragile.  CMP and CBC checked today.  Push fluids and nutrition. ______________________________   Eppie Gibson, M.D.  This document serves as a record of services personally performed by Eppie Gibson, MD. It was created on her behalf by Maryla Morrow, a trained medical scribe. The creation of this record is based on the scribe's personal observations and the provider's statements to them. This document has been checked and approved by the attending provider.

## 2017-04-26 NOTE — Progress Notes (Signed)
Sabrina Romero presents for her 12th fraction of radiation to her Tongue. She denies pain. She has some fatigue. She is not feeling well. She is having nausea and vomiting with her tube feedings. She is instilling about 2 cans of nutritional supplements daily. She is able to instill water without difficulty. She is drinking minimal water orally. She has had several episodes of dizziness, sweating, and feeling like passing out over the past few days. She is unable to stand for orthostatic blood pressure today. The skin to her neck is slightly red, and she continues to use biafine twice daily.  BP 100/71   Pulse (!) 101   Temp 97.9 F (36.6 C)   Ht 5\' 4"  (1.626 m)   Wt 170 lb 12.8 oz (77.5 kg)   BMI 29.32 kg/m    Wt Readings from Last 3 Encounters:  04/26/17 170 lb 12.8 oz (77.5 kg)  04/20/17 174 lb 3.2 oz (79 kg)  04/12/17 174 lb 12.8 oz (79.3 kg)

## 2017-04-26 NOTE — Progress Notes (Signed)
Oncology Nurse Navigator Documentation  Met with Ms. Riggin during RT to provide assistance with PRN suctioning.  She was accompanied by her husband. She had minimal secretions today, did not require much suctioning. We discussed her attendance at [facility] [place] next week Tuesday following her 8:30 RT.  She voiced understanding she will come to [place] after RT to be seen by Nutrition, SLP, PT and SW. She requested letter to support her sister's continuing absence from work to assist with her care.  She understands I will prepare letter and give to her.  [name], RN, BSN, [place] [facility] Oncology Nurse [place] at [place] [phone]

## 2017-04-27 ENCOUNTER — Ambulatory Visit
Admission: RE | Admit: 2017-04-27 | Discharge: 2017-04-27 | Disposition: A | Discharge status: Home / Self Care | Payer: Medicare Other | Source: Ambulatory Visit | Attending: Radiation Oncology | Admitting: Radiation Oncology

## 2017-04-27 DIAGNOSIS — Z510 Encounter for antineoplastic radiation therapy: Secondary | ICD-10-CM | POA: Diagnosis not present

## 2017-04-28 ENCOUNTER — Ambulatory Visit
Admission: RE | Admit: 2017-04-28 | Discharge: 2017-04-28 | Disposition: A | Discharge status: Home / Self Care | Payer: Medicare Other | Source: Ambulatory Visit | Attending: Radiation Oncology | Admitting: Radiation Oncology

## 2017-04-28 DIAGNOSIS — Z510 Encounter for antineoplastic radiation therapy: Secondary | ICD-10-CM | POA: Diagnosis not present

## 2017-04-29 ENCOUNTER — Encounter: Payer: Self-pay | Admitting: *Deleted

## 2017-04-29 ENCOUNTER — Telehealth: Payer: Self-pay | Admitting: *Deleted

## 2017-04-29 ENCOUNTER — Ambulatory Visit
Admission: RE | Admit: 2017-04-29 | Discharge: 2017-04-29 | Disposition: A | Discharge status: Home / Self Care | Payer: Medicare Other | Source: Ambulatory Visit | Attending: Radiation Oncology | Admitting: Radiation Oncology

## 2017-04-29 DIAGNOSIS — Z510 Encounter for antineoplastic radiation therapy: Secondary | ICD-10-CM | POA: Diagnosis not present

## 2017-04-29 NOTE — Progress Notes (Signed)
Oncology Nurse Navigator Documentation  Per patient request, prepared letter in support of her sister's continuing leave from work through 08/22/17 so she can assist with her care.  Letter sent by email.  Copy scanned, in Media.  [name], RN, BSN, [place] [facility] Oncology Nurse [place] at [place] [phone]

## 2017-04-29 NOTE — Telephone Encounter (Signed)
Oncology Nurse Navigator Documentation  Spoke with Ms. Holstad, confirmed email for sending letter on behalf of her sister's continuing absence from work so she can provide care.  Ms. Nine indicated she does not yet have a PCP.  She agreed to referral by Dr. Isidore Moos, requested close proximity to her home.  Dr. Isidore Moos informed.  [name], RN, BSN, [place] [facility] Oncology Nurse [place] at [place] [phone]

## 2017-04-29 NOTE — Progress Notes (Signed)
Oncology Nurse Navigator Documentation  Met with Ms. Burklow during RT to assist with suctioning.  She was accompanied by her husband. She required minimal suctioning today.  Secretions continue to decrease in volume as treatments proceed.  [name], RN, BSN, [place] [facility] Oncology Nurse [place] at [place] [phone]

## 2017-04-30 ENCOUNTER — Other Ambulatory Visit: Payer: Self-pay | Admitting: Radiation Oncology

## 2017-04-30 ENCOUNTER — Ambulatory Visit
Admission: RE | Admit: 2017-04-30 | Discharge: 2017-04-30 | Disposition: A | Discharge status: Home / Self Care | Payer: Medicare Other | Source: Ambulatory Visit | Attending: Radiation Oncology | Admitting: Radiation Oncology

## 2017-04-30 DIAGNOSIS — Z510 Encounter for antineoplastic radiation therapy: Secondary | ICD-10-CM | POA: Diagnosis not present

## 2017-04-30 DIAGNOSIS — C023 Malignant neoplasm of anterior two-thirds of tongue, part unspecified: Secondary | ICD-10-CM

## 2017-04-30 MED ORDER — ONDANSETRON HCL 8 MG PO TABS: 8.0000 mg | ORAL_TABLET | Freq: Three times a day (TID) | ORAL | 3 refills | Status: AC | PRN

## 2017-05-02 ENCOUNTER — Ambulatory Visit: Payer: Medicare Other

## 2017-05-03 ENCOUNTER — Ambulatory Visit
Admission: RE | Admit: 2017-05-03 | Discharge: 2017-05-03 | Disposition: A | Discharge status: Home / Self Care | Payer: Medicare Other | Source: Ambulatory Visit | Attending: Radiation Oncology | Admitting: Radiation Oncology

## 2017-05-03 ENCOUNTER — Other Ambulatory Visit: Payer: Self-pay | Admitting: Oncology

## 2017-05-03 VITALS — BP 130/66 | HR 101 | Temp 98.6°F | Resp 20 | Wt 165.8 lb

## 2017-05-03 DIAGNOSIS — Z510 Encounter for antineoplastic radiation therapy: Secondary | ICD-10-CM | POA: Diagnosis not present

## 2017-05-03 DIAGNOSIS — C023 Malignant neoplasm of anterior two-thirds of tongue, part unspecified: Secondary | ICD-10-CM

## 2017-05-03 LAB — BASIC METABOLIC PANEL
Anion Gap: 9 mEq/L (ref 3–11)
BUN: 12.2 mg/dL (ref 7.0–26.0)
CHLORIDE: 101 meq/L (ref 98–109)
CO2: 28 mEq/L (ref 22–29)
CREATININE: 0.8 mg/dL (ref 0.6–1.1)
Calcium: 10.5 mg/dL — ABNORMAL HIGH (ref 8.4–10.4)
EGFR: 85 mL/min/{1.73_m2} — ABNORMAL LOW (ref >90)
Glucose: 102 mg/dl (ref 70–140)
Potassium: 4.3 mEq/L (ref 3.5–5.1)
Sodium: 137 mEq/L (ref 136–145)

## 2017-05-03 MED ORDER — PROMETHAZINE HCL 6.25 MG/5ML PO SYRP: 25.0000 mg | ORAL_SOLUTION | Freq: Four times a day (QID) | ORAL | 5 refills | Status: AC | PRN

## 2017-05-03 NOTE — Progress Notes (Signed)
Sabrina Romero completed 17th fraction of radiation to tongue.  Patient reports having pain to left side of face reporting 3 out of 10.  Patient was treated today and required suction for thick secretions.  Patient vomited after getting up from table and was suctioned.  Per Dr. Isidore Moos patient was scheduled an appointment for a new PCP.  Card and appt given to patient with [name].  Patient reports only taking very little by mouth and is using feeding tube daily.  Is getting 2-3 cans in PEG tube.  Patient is also intaking 1 quart a day of water.  Only able to sip water in small increments.  Thick secretions are reported.  Patient is taking zofran continuously for nausea.  Vomiting/coughing up stomach acid daily.  She reports continued dizziness and lightheadedness daily.  Patient had 5 lbs weight loss and unable to stand for orthostatic vital signs.    Vitals:   05/03/17 0913  BP: 130/66  Pulse: (!) 101  Resp: 20  Temp: 98.6 F (37 C)  TempSrc: Oral  SpO2: 100%  Weight: 165 lb 12.8 oz (75.2 kg)   Wt Readings from Last 3 Encounters:  05/03/17 165 lb 12.8 oz (75.2 kg)  04/26/17 170 lb 12.8 oz (77.5 kg)  04/20/17 174 lb 3.2 oz (79 kg)

## 2017-05-04 ENCOUNTER — Ambulatory Visit
Admission: RE | Admit: 2017-05-04 | Discharge: 2017-05-04 | Disposition: A | Discharge status: Home / Self Care | Payer: Medicare Other | Source: Ambulatory Visit | Attending: Radiation Oncology | Admitting: Radiation Oncology

## 2017-05-04 ENCOUNTER — Encounter: Payer: Self-pay | Admitting: *Deleted

## 2017-05-04 ENCOUNTER — Ambulatory Visit: Payer: Medicare Other

## 2017-05-04 ENCOUNTER — Ambulatory Visit: Payer: Medicare Other | Attending: Radiation Oncology | Admitting: Physical Therapy

## 2017-05-04 DIAGNOSIS — R471 Dysarthria and anarthria: Secondary | ICD-10-CM | POA: Diagnosis present

## 2017-05-04 DIAGNOSIS — M6281 Muscle weakness (generalized): Secondary | ICD-10-CM | POA: Insufficient documentation

## 2017-05-04 DIAGNOSIS — R262 Difficulty in walking, not elsewhere classified: Secondary | ICD-10-CM | POA: Diagnosis present

## 2017-05-04 DIAGNOSIS — R1312 Dysphagia, oropharyngeal phase: Secondary | ICD-10-CM

## 2017-05-04 DIAGNOSIS — R293 Abnormal posture: Secondary | ICD-10-CM | POA: Diagnosis not present

## 2017-05-04 DIAGNOSIS — Z510 Encounter for antineoplastic radiation therapy: Secondary | ICD-10-CM | POA: Diagnosis not present

## 2017-05-04 DIAGNOSIS — C023 Malignant neoplasm of anterior two-thirds of tongue, part unspecified: Secondary | ICD-10-CM

## 2017-05-04 MED ORDER — OSMOLITE 1.5 CAL PO LIQD: ORAL | 0 refills | Status: DC

## 2017-05-04 NOTE — Patient Instructions (Signed)
SWALLOWING EXERCISES Do these until 6 months after your last day of radiaiton, then 3 times per week afterwards  1. Effortful Swallows - Press your tongue against the roof of your mouth for 3 seconds, then squeeze          the muscles in your neck while you swallow your saliva or a sip of water - Repeat 20 times, 2-3 times a day, and use whenever you eat or drink  2. Pitch Raise - Repeat "he", once per second in as high of a pitch as you can - Repeat 20 times, 2-3 times a day  3. Shaker Exercise - head lift   ---------DO THIS WHEN SECRETIONS ARE LESS - Lie flat on your back in your bed or on a couch without pillows - Raise your head and look at your feet - KEEP YOUR SHOULDERS DOWN - HOLD FOR 45-60 SECONDS, then lower your head back down - Repeat 3 times, 2-3 times a day  4. Mendelsohn Maneuver - "half swallow" exercise - Start to swallow, and keep your Adam's apple up by squeezing hard with the            muscles of the throat - Hold the squeeze for 5-7 seconds and then relax - Repeat 20 times, 2-3 times a day *use a wet spoon if your mouth gets dry*  5. Tongue Stretch - Move your tongue forward and back, left and right, and straight up 10x each - 2-3 times a day  6. Breath Hold - Say "HUH!" loudly, then hold your breath for 3 seconds at your voice box - Repeat 20 times, 2-3 times a day  7. Chin pushback - Open your mouth  - Place your fist UNDER your chin near your neck, and push back with your fist for 5 seconds - Repeat 10 times, 2-3 times a day        9.  Open mouth swallow  - Open your mouth and swallow your saliva  - Repeat 10 times, 2-3 times a day

## 2017-05-04 NOTE — Progress Notes (Addendum)
Nutrition Assessment   Reason for Assessment:   Patient seen in Head and Neck Multidisciplinary clinic.    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  Patient was initially diagnosised 09/23/15 in [place] and underwent partial glossectomy, refused adjuvant treatment and only wanted surveillance follow-up.  Patient and husband moved to [facility] in January 2018 due to husband's job transferred.  Patient has undergone 2nd partial glossectomy surgery on 3/27 at [facility] and had PEG placed on 02/21/17.  Patient is currently receiving radiation therapy with final treatment planned on 7/3.    Past medical history of MS, HTN.  Patient seen in clinic with husband.  Patient reports that she takes 3 cans of osmolite 1.5 via PEG tube. Reports around 9:30am give 1/2 syringe full of water (23ml) before feeding, then 1/2 can of formula, then 84ml water waits 20 minutes then give 23ml of water, other 1/2 can of formula, and 82ml of water after.  She repeats this process between 3-4pm and 7-8pm.  Reports issues with reflux and has been taking over the counter pepcid for this.  Recently has added 1 quart of water daily spread out during the day that she gives via tube.  Sips water orally but does not take anything else via mouth.    Current 3 cans of tube feeding provides 1066 calories, 45 g, 572ml free water.   Noted patient with increased secretions and order has been placed for home suctioning device.    Nutrition Focused Physical Exam: deferred    Medications: scopolamine, phenergan, zofran, omega 3, MVI, Vit D3  Labs: reviewed  Anthropometrics:   Height: 64 inches Weight: 165 lb 12.8 oz today UBW: 200 lb about 2 years ago per husband. Noted wt of 184 lb 3.2 oz 12/29/16 BMI: 28  10% weight loss in 4 months   Estimated Energy Needs  Kcals: 1925-2300 calories/d Protein: 96-115 g/d Fluid: 2.3 L/d  NUTRITION DIAGNOSIS: Inadequate oral intake related to  cancer and cancer related treatments, dysphagia as evidenced by 10% weight loss in 4 months and utilizing PEG for nutrition   MALNUTRITION DIAGNOSIS: Patient meets criteria for severe malnutrition in context of chronic illness related to 10% weight loss in 4 months and eating less than 75% of estimated needs for > or equal to 1 month.     INTERVENTION:   Reviewed how patient is giving tube feeding to prevent or decrease risk of reflux.  Patient reports she is not pushing but letting nutrition drip into tube.  Does dilute formula with little bit of water.  Encouraged patient to be sure that she is sitting up during feeding and at least 1 hour after feeding.  Discussed option of continuous feeding via pump as well.  Patient declined this option at this time.  Discussed importance of adequate calories and protein to prevent further weight loss and recommended patient increase tube feeding.  Patient agreeable to increase tube feeding by 1 can of osmolite 1.5 at this time.    To meet nutritional needs recommend osmolite 1.5, 6 cans (1 1/2 cans QID) per day to provide. 2133 calories, 89 g of protein and 1055ml free water with 141ml flush with feeding (QID) and additional 777ml (1 1/2 16oz bottle) via tube for total of 2366ml/d. May need to add promod or equivalent 72ml daily for additional 100 calories and 10 g of protein to better meet protein needs.    Noted SLP recommended NPO with water protocol.  MONITORING, EVALUATION, GOAL: Patient will utilize PEG for adequate nutrition to prevent weight loss   NEXT VISIT: to be scheduled  Malicia Blasdel B. Zenia Resides, [place], [place] Registered Dietitian [phone] (pager)

## 2017-05-04 NOTE — Progress Notes (Signed)
Oncology Nurse Navigator Documentation  Met with Ms. Marion during [facility] [place].  She was accompanied by her husband.  Provided verbal and written overview of [facility], the clinicians who will be seeing her, encouraged her to ask questions during her time with them.  Discussed addition of Monday appts following RT and PUT with Mikey Bussing, NP, [facility], for oversight of symptom mgt and acute medical needs as she progresses with RT.  She understands Erasmo Downer will work closely with Dr. Isidore Moos.  I provided her an Epic appt calendar noting [facility] appts.  She was seen by Nutrition, SLP, PT, SW and [place].    Spoke with her at end of [facility], addressed questions.  She understands I can be contacted with needs/concerns.  [name], RN, BSN, [place] at [place] [phone]

## 2017-05-04 NOTE — Therapy (Signed)
[facility] [address] [place], [place], [address] Phone: [phone]   Fax:  [phone]  Speech Language Pathology Evaluation  Patient Details  Name: Sabrina Romero MRN: [id number] Date of Birth: 05-23-1966 Referring Provider: Eppie Gibson  Encounter Date: 05/04/2017      End of Session - 05/04/17 1102    Visit Number 1   Number of Visits 3   Date for SLP Re-Evaluation 07/16/17   SLP Start Time 27   SLP Stop Time  1100   SLP Time Calculation (min) 41 min      Past Medical History:  Diagnosis Date  . Hypertension   . Multiple sclerosis ([place])   . Seasonal allergies   . 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 ABDOMINAL HYSTERECTOMY    . KNEE SURGERY Bilateral   . PARTIAL GLOSSECTOMY Left 10/28/2015   Left oral Tongue partial glossectomy.   . TUBAL LIGATION    . WISDOM TOOTH EXTRACTION      There were no vitals filed for this visit.      Subjective Assessment - 05/04/17 1034    Subjective Second partial glossectomy was end of March 2018.   Patient is accompained by: Family member  husband   Currently in Pain? No/denies            SLP Evaluation [facility] - 05/04/17 1034      SLP Visit Information   SLP Received On 05/04/17   Referring Provider Eppie Gibson   Onset Date September 2017   Medical Diagnosis cancer of anterior 2/3 of tongue     Subjective   Subjective Pt with first partial glossectomy in 2016 without rad. Scan in fall 2017 showed recurrence.     General Information   HPI Pt witih second partial glossectomy in March 2018, now PEG dependent and receiving rad to tumor bed and bil nodes. Eating rare pureed and rarely drinking thin liquids and Boost. Three cans TF/day now, about ready to incr to 4.      Prior Functional Status   Cognitive/Linguistic Baseline Within functional limits     Cognition   Overall  Cognitive Status Within Functional Limits for tasks assessed     Auditory Comprehension   Overall Auditory Comprehension Appears within functional limits for tasks assessed     Verbal Expression   Overall Verbal Expression Appears within functional limits for tasks assessed     Oral Motor/Sensory Function   Overall Oral Motor/Sensory Function Impaired at baseline   Labial ROM Within Functional Limits   Labial Strength Within Functional Limits   Lingual ROM Reduced right;Reduced left   Lingual Symmetry --  Could not assess due to lack of musculature   Lingual Strength Reduced   Lingual Sensation Reduced   Lingual Coordination --  almost absent   Facial ROM Within Functional Limits   Velum Within Functional Limits   Overall Oral Motor/Sensory Function SLP appreciates anatomical changes from double partial glossectomies. Pt has primarily a stump on the left side and slightly more tissue on the right but mobility is still severely reduced.      Motor Speech   Overall Motor Speech Impaired at baseline   Articulation Impaired   Level of Impairment Word   Intelligibility Intelligibility reduced   Word 50-74% accurate   Phrase 25-49% accurate   Sentence 25-49% accurate   Conversation 50-74% accurate   Interfering Components Anatomical limitations  Effective Techniques Slow rate;Over-articulate  pt not bothered by herspeech;SLP educated regardless of this     Pt currently tolerates limited POs given lingual anatomy and in middle of rad tx for head/neck CA. She is currently PEG dependent, as described above in HPI. Pt denies overt s/s aspiration PNA. Pt has suspected inability to clear pharyngeal secretions due to presence of muffled voice, and complaints of pharyngeal residue. Pt usually uses suction to clear oral and pharyngeal secretions, with an anti-salivary used for rad tx to minimize secretions during that time.  POs: Pt drank one sip of Boost and thyroid elevation appeared WFL.  Swallows appeared timely. Oral residue incr'd as time progressed since pt's swallow. Pt's voice did not clear/become more clear and remained just as muffled as prior to sip of Boost with cued and spontaneous subsequent swallows.  Pt's swallow deemed abnormal - severely impaired at this time. Pt was recommended to be NPO with water protocol. SLP educated pt/husband on this today.   Because data states the risk for dysphagia during and after radiation treatment is high due to undergoing radiation tx, SLP taught pt about the possibility of reduced/limited ability for PO intake during rad tx. SLP encouraged pt to continue swallowing H2O (adhering to water protocol guidelines) as far into rad tx as possible.   SLP educated pt re: changes to swallowing musculature after rad tx, and why adherence to dysphagia HEP provided today and H2O consumption as described above was necessary to inhibit muscular disuse atrophy and to reduce muscle fibrosis following rad tx. SLP suspects pt already with some degree of atrophy as she has been tube dependent since late March 2018. Pt demonstrated understanding of these things to SLP.    SLP then developed a HEP for pt and pt was instructed how to perform exercises involving lingual, vocal, and pharyngeal strengthening. SLP performed each exercise and pt return demonstrated each exercise. SLP ensured pt performance was correct prior to moving on to next exercise. Pt was instructed to complete this program at least twice a day.                    SLP Education - 05/04/17 1059    Education provided Yes   Education Details HEP, late effects of head/neck radiation on swallowing, safest at present to have nutrition via tube, water protocol    Person(s) Educated Patient;Spouse   Methods Explanation;Demonstration;Verbal cues;Handout   Comprehension Verbalized understanding;Returned demonstration;Verbal cues required;Need further instruction          SLP Short  Term Goals - 05/04/17 1206      SLP SHORT TERM GOAL #1   Title pt will complete HEP with occasional min A   Time 1   Period --  visti   Status New     SLP SHORT TERM GOAL #2   Title pt will tell SLP why she is completing HEP with min A   Time 1   Period --  visit   Status New     SLP SHORT TERM GOAL #3   Title pt will tell SLP guidelines of water protocol with mod A (yes/no questions)   Time 1   Period --  visit   Status New     SLP SHORT TERM GOAL #4   Title pt will tell SLP two overt s/s of aspiration PNA with modified independence   Time 1   Period --  visits   Status New  SLP Long Term Goals - May 09, 2017 1209      SLP LONG TERM GOAL #1   Title pt will complete HEP with rare min A over two sessions    Time 2   Period --  visits   Status New     SLP LONG TERM GOAL #2   Title pt will tell SLP why a food journal is helpful in returning to most liberal diet   Time 2   Period --  visits   Status New          Plan - 05-09-17 1202    Clinical Impression Statement Pt with disordered oropharyngeal swallowing, so probability of swallowing difficulty is increased dramatically with the initiation of radiation therapy. Pt will need to be followed closely by RN/MD for s/s aspiration PNA. Pt would benefit from skilled ST for regular assessment of accurate HEP completion as well as for safety with POs both during and following treatment/s. Education was provided today - see "education" above.    Speech Therapy Frequency --  approx once every four weeks   Duration --  three total visits   Treatment/Interventions Aspiration precaution training;Pharyngeal strengthening exercises;Diet toleration management by SLP;Compensatory techniques;Internal/external aids;SLP instruction and feedback;Patient/family education;Trials of upgraded texture/liquids  any or all may be used   Potential to Achieve Goals Fair   Potential Considerations Previous level of function;Severity  of impairments   Consulted and Agree with Plan of Care Patient      Patient will benefit from skilled therapeutic intervention in order to improve the following deficits and impairments:   Oropharyngeal dysphagia  Dysarthria and anarthria      G-Codes - 09-May-2017 1212    Functional Assessment Tool Used NOMS   Functional Limitations Swallowing   Swallow Current Status (J5701) At least 80 percent but less than 100 percent impaired, limited or restricted   Swallow Goal Status (X7939) At least 60 percent but less than 80 percent impaired, limited or restricted      Problem List Patient Active Problem List   Diagnosis Date Noted  . Multiple lung nodules on CT 03/23/2017  . Multiple sclerosis ([place]) 12/31/2016  . Hypertension 12/31/2016  . 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 Cancer of anterior two-thirds of tongue ([place]) 12/29/2016    [facility] ,[place], [place]  09-May-2017, 12:12 PM  [facility] [address] [place], [place], [address] Phone: [phone]   Fax:  [phone]  Name: Alexiah Koroma MRN: [id number] Date of Birth: Feb 24, 1966

## 2017-05-04 NOTE — Progress Notes (Signed)
   Weekly Management Note:  Outpatient    ICD-10-CM   1. Cancer of anterior two-thirds of tongue (HCC) C02.3 promethazine (PHENERGAN) 6.25 MG/5ML syrup    Basic metabolic panel    Ambulatory referral to Home Health    Current Dose:  34 Gy  Projected Dose: 66 Gy   Narrative:  The patient presents for under treatment assessment. She is accompanied by her husband today. The chart was checked.  3/10 left facial pain.  Main complaint - thick secretions.  She initially declined having a suction device at home but would now like one.  See nursing note for intake report.  She had a "bad day" with vomiting this weekend r/t thick secretions. Cont'd dizziness.  Reports history of LE edema that waxes and wanes.  Denies CHF or kidney issues.  Today pedal edema is worse  WE have referred her to a PCP in [place].  Physical Findings:  weight is 165 lb 12.8 oz (75.2 kg). Her oral temperature is 98.6 F (37 C). Her blood pressure is 130/66 and her pulse is 101 (abnormal). Her respiration is 20 and oxygen saturation is 100%.   Wt Readings from Last 3 Encounters:  05/03/17 165 lb 12.8 oz (75.2 kg)  04/26/17 170 lb 12.8 oz (77.5 kg)  04/20/17 174 lb 3.2 oz (79 kg)  Patient presents in a wheelchair. NAD She has copious saliva in the mouth and mucositis over the tongue. No oral thrush. Skin intact over neck. Pedal pitting edema.  CBC    Component Value Date/Time   WBC 9.0 04/26/2017 1051   RBC 3.78 04/26/2017 1051   HGB 11.8 04/26/2017 1051   HCT 34.9 04/26/2017 1051   PLT 260 04/26/2017 1051   MCV 92.2 04/26/2017 1051   MCH 31.2 04/26/2017 1051   MCHC 33.8 04/26/2017 1051   RDW 13.5 04/26/2017 1051   LYMPHSABS 0.7 (L) 04/26/2017 1051   MONOABS 0.7 04/26/2017 1051   EOSABS 0.1 04/26/2017 1051   BASOSABS 0.0 04/26/2017 1051   CMP     Component Value Date/Time   NA 137 05/03/2017 1052   K 4.3 05/03/2017 1052   CO2 28 05/03/2017 1052   GLUCOSE 102 05/03/2017 1052   BUN 12.2 05/03/2017  1052   CREATININE 0.8 05/03/2017 1052   CALCIUM 10.5 (H) 05/03/2017 1052   PROT 6.9 04/26/2017 1051   ALBUMIN 3.6 04/26/2017 1051   AST 51 (H) 04/26/2017 1051   ALT 71 (H) 04/26/2017 1051   ALKPHOS 132 04/26/2017 1051   BILITOT 0.53 04/26/2017 1051     Impression:  The patient is tolerating radiotherapy. She does not seems to be dehydrated per labs, clinical exam.  Multiple co morbidities, without contact in a PCP's clinic yet.  Plan:  Continue radiotherapy as planned.  Scheduled to see med/onc for symptom management weekly... She has multiple comorbidities and is relatively fragile.  Take fluids and nutrition as tolerated.  [facility] tomorrow for support.  Y[place]auer suction ordered from home health. ______________________________   Eppie Gibson, M.D.  This document serves as a record of services personally performed by Eppie Gibson, MD. It was created on her behalf by Maryla Morrow, a trained medical scribe. The creation of this record is based on the scribe's personal observations and the provider's statements to them. This document has been checked and approved by the attending provider.

## 2017-05-04 NOTE — Addendum Note (Signed)
Addended by: Jennet Maduro B on: 05/04/2017 03:39 PM   Modules accepted: Orders

## 2017-05-04 NOTE — Therapy (Signed)
[facility], [place], [address] Phone: [phone]   Fax:  [phone]  Physical Therapy Evaluation  Patient Details  Name: Sabrina Romero MRN: [id number] Date of Birth: 06/25/1966 Referring Provider: Dr. Eppie Gibson  Encounter Date: 05/04/2017      PT End of Session - 05/04/17 1026    Visit Number 1   Number of Visits 1   PT Start Time 0910   PT Stop Time 0950   PT Time Calculation (min) 40 min   Activity Tolerance Patient tolerated treatment well   Behavior During Therapy [facility] for tasks assessed/performed      Past Medical History:  Diagnosis Date  . Hypertension   . Multiple sclerosis ([place])   . Seasonal allergies   . 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 ABDOMINAL HYSTERECTOMY    . KNEE SURGERY Bilateral   . PARTIAL GLOSSECTOMY Left 10/28/2015   Left oral Tongue partial glossectomy.   . TUBAL LIGATION    . WISDOM TOOTH EXTRACTION      There were no vitals filed for this visit.       Subjective Assessment - 05/04/17 1004    Subjective Had a flare-up of MS about a year ago that was really bad.  MS has mainly affected her legs.   Patient is accompained by: Family member  husband, Gene   Pertinent History Left tongue squamous cell carcinoma, recurrent.  Initial diagnosis was in [place] 09/23/15, and she was treated with partial glossectomy but refused adjuvant treatment, only wanting surveillance despite suspicion of recurrent disease.  Moved to [facility] in January 2018; had second surgery 02/16/17 at [facility] of another partial glossectomy.  Started XRT 04/07/17 and final treatment anticipated for 05/25/17.  No current chemo. Multiple lung nodules have been noted on CT which may be metastases. Multiple sclerosis, HTN, h/o bilat. knee surgery.   Patient Stated Goals get info from all clinic providers   Currently in Pain? No/denies             [facility] PT Assessment - 05/04/17 0001      Assessment   Medical Diagnosis left tongue squamous cell carcinoma s/p two partial glossectomies   Referring Provider Dr. Eppie Gibson   Onset Date/Surgical Date 09/23/15   Prior Therapy none     Precautions   Precautions Fall;Other (comment)   Precaution Comments cancer precautions (may have lung mets)     Restrictions   Weight Bearing Restrictions No     Balance Screen   Has the patient fallen in the past 6 months No   Has the patient had a decrease in activity level because of a fear of falling?  No   Is the patient reluctant to leave their home because of a fear of falling?  No     Home Environment   Living Environment Private residence   Living Arrangements Spouse/significant other   Type of [place] scooter;Walker - 4 wheels     Prior Function   Level of [place] device for independence   Leisure no regular exercise     Cognition   Overall Cognitive Status Within Functional Limits for tasks assessed     Observation/Other Assessments   Observations sitting in wheelchair; legs both appear swollen at ankles and feet   Skin Integrity neck  dissection incision well-healed     Functional Tests   Functional tests Sit to Stand     Sit to Stand   Comments pt. unable to stand without UE support     Posture/Postural Control   Posture/Postural Control Postural limitations   Postural Limitations Forward head;Rounded Shoulders  significant forward head     ROM / Strength   AROM / PROM / Strength AROM     AROM   Overall AROM Comments Shoulder AROM WFL on right, limited on left to approx. 110 degrees of abduction and flexion  says she jammed left shoulder trying to keep balance   AROM Assessment Site Cervical   Cervical Flexion WFL   Cervical Extension WFL   Cervical - Right Side Bend 50% loss   Cervical -  Left Side Bend 20% loss   Cervical - Right Rotation 25% loss   Cervical - Left Rotation 25% loss     Palpation   Palpation comment soft fullness at right neck; full at left jawline above neck dissection incision     Transfers   Transfers Not assessed   Comments pt. says she can transfer independently, but cannot stand without using hands  to push off--not observed today     Ambulation/Gait   Ambulation/Gait No  not viewed today;   Gait Comments not observed today, but pt. says she uses an electric scooter and also walks short distances (such as from bed to bathroom) with a rollator walker; says she can transfer independently           LYMPHEDEMA/ONCOLOGY QUESTIONNAIRE - 05/04/17 1021      Type   Cancer Type left tongue squamous cell carcinoma s/p two resections     Surgeries   Other Surgery Date 02/16/17  second tongue resection date; 09/23/15 first     Treatment   Active Radiation Treatment Yes   Date 04/07/17  33 fractions to neck     Lymphedema Assessments   Lymphedema Assessments Head and Neck     Head and Neck   4 cm superior to sternal notch around neck 33.9 cm   6 cm superior to sternal notch around neck 34.8 cm   8 cm superior to sternal notch around neck 36.4 cm         Objective measurements completed on examination: See above findings.                  PT Education - 05/04/17 1026    Education provided Yes   Education Details neck ROM, posture, breathing, benefits of exercise, CURE article on staying active, lymphedema and PT info   Person(s) Educated Patient;Spouse   Methods Explanation;Handout   Comprehension Verbalized understanding                 Head and Neck Clinic Goals - 05/04/17 1037      Patient will be able to verbalize understanding of a home exercise program for cervical range of motion, posture, and walking.    Status Achieved     Patient will be able to verbalize understanding of proper sitting and  standing posture.    Status Achieved     Patient will be able to verbalize understanding of lymphedema risk and availability of treatment for this condition.    Status Achieved            Plan - 05/04/17 1027    Clinical Impression Statement This woman is s/p partial glossectomies x 2 for left tongue squamous  cell carcinoma and currently undergoing XRT for that.  She has lung nodules spotted on CT and may need chemo for that later. She has multiple sclerosis and limited mobility as a result; she is in a wheelchair today and normally uses an Transport planner or rollator for short distance ambulation.  She has limited neck ROM and posture abnormality; also has some neck swelling currently s/p surgery.   History and Personal Factors relevant to plan of care: multiple sclerosis, recovering from neck dissection and partial glossectomy   Clinical Presentation Evolving   Clinical Presentation due to: currently undergoing XRT   Clinical Decision Making Moderate   Rehab Potential Good   PT Frequency One time visit   PT Treatment/Interventions Patient/family education   PT Next Visit Plan None at this time; may need therapy going forward as she has a high risk for lymphedema   PT Home Exercise Plan try to do UE exercise or consider finding a pool for water exercise   Consulted and Agree with Plan of Care Patient      Patient will benefit from skilled therapeutic intervention in order to improve the following deficits and impairments:  Postural dysfunction, Decreased range of motion, Increased edema, Decreased mobility  Visit Diagnosis: Abnormal posture - Plan: PT plan of care cert/re-cert  Muscle weakness (generalized) - Plan: PT plan of care cert/re-cert  Difficulty in walking, not elsewhere classified - Plan: PT plan of care cert/re-cert      G-Codes - 81/27/51 1037    Functional Assessment Tool Used (Outpatient Only) clinical judgement   Functional Limitation Mobility:  Walking and moving around   Mobility: Walking and Moving Around Current Status (Z0017) At least 40 percent but less than 60 percent impaired, limited or restricted   Mobility: Walking and Moving Around Goal Status [phone]) At least 40 percent but less than 60 percent impaired, limited or restricted   Mobility: Walking and Moving Around Discharge Status [phone]) At least 40 percent but less than 60 percent impaired, limited or restricted       Problem List Patient Active Problem List   Diagnosis Date Noted  . Multiple lung nodules on CT 03/23/2017  . Multiple sclerosis ([place]) 12/31/2016  . Hypertension 12/31/2016  . 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 Cancer of anterior two-thirds of tongue ([place]) 12/29/2016    Eliott Amparan 05/04/2017, 10:39 AM  [facility] [place], [place], [address] Phone: [phone]   Fax:  [phone]  Name: Sabrina Romero MRN: [id number] Date of Birth: [birth year]  Serafina Royals, PT 05/04/17 10:39 AM

## 2017-05-04 NOTE — Progress Notes (Signed)
Met Ival Bible during radiation therapy for oral suctioning.  Patient managed suctioning without assistance.  Patient began vomiting during suction and airway was cleared prior to patient being set up on table in head cast.

## 2017-05-04 NOTE — Progress Notes (Signed)
Nutrition  Spoke with patient via phone to confirm home health agency that is sending tube feeding supplies.  Patient reports that [facility] is sending her tube feeding supplies.  Order entered under medications for tube feeding order and communicated with Alfred. Zenia Resides, [place], [place] Registered Dietitian [phone] (pager)

## 2017-05-05 ENCOUNTER — Telehealth: Payer: Self-pay

## 2017-05-05 ENCOUNTER — Ambulatory Visit
Admission: RE | Admit: 2017-05-05 | Discharge: 2017-05-05 | Disposition: A | Discharge status: Home / Self Care | Payer: Medicare Other | Source: Ambulatory Visit | Attending: Radiation Oncology | Admitting: Radiation Oncology

## 2017-05-05 DIAGNOSIS — Z510 Encounter for antineoplastic radiation therapy: Secondary | ICD-10-CM | POA: Diagnosis not present

## 2017-05-05 NOTE — Telephone Encounter (Signed)
Nutrition  Called patient for nutrition follow-up as seen in clinic yesterday and increased tube feeding regimen.  Left voicemail to call me back.    Next follow-up: June 20th  Lashae Wollenberg B. Zenia Resides, [place], [place] Registered Dietitian [phone] (pager)

## 2017-05-06 ENCOUNTER — Ambulatory Visit
Admission: RE | Admit: 2017-05-06 | Discharge: 2017-05-06 | Disposition: A | Discharge status: Home / Self Care | Payer: Medicare Other | Source: Ambulatory Visit | Attending: Radiation Oncology | Admitting: Radiation Oncology

## 2017-05-06 DIAGNOSIS — Z510 Encounter for antineoplastic radiation therapy: Secondary | ICD-10-CM | POA: Diagnosis not present

## 2017-05-07 ENCOUNTER — Ambulatory Visit
Admission: RE | Admit: 2017-05-07 | Discharge: 2017-05-07 | Disposition: A | Discharge status: Home / Self Care | Payer: Medicare Other | Source: Ambulatory Visit | Attending: Radiation Oncology | Admitting: Radiation Oncology

## 2017-05-07 DIAGNOSIS — Z510 Encounter for antineoplastic radiation therapy: Secondary | ICD-10-CM | POA: Diagnosis not present

## 2017-05-07 NOTE — Progress Notes (Signed)
Head & Neck Multidisciplinary Clinic Clinical Social Work  Clinical Social Work met with patient/family at head & neck multidisciplinary clinic to offer support and assess for psychosocial needs.  The patient was accompanied by her spouse.  Patient and spouse shared they are coping well with cancer illness thus far and have no concerns.  Patient shared minimal information during visit.  Clinical Social Work briefly discussed Clinical Social Work role and [employer] support programs/services.  Clinical Social Work encouraged patient to call with any additional questions or concerns.   Maryjean Morn, MSW, LCSW, OSW-C Clinical Social Worker [facility] [phone]

## 2017-05-08 ENCOUNTER — Ambulatory Visit: Payer: Medicare Other

## 2017-05-09 ENCOUNTER — Ambulatory Visit: Payer: Medicare Other

## 2017-05-10 ENCOUNTER — Ambulatory Visit (HOSPITAL_BASED_OUTPATIENT_CLINIC_OR_DEPARTMENT_OTHER): Payer: Medicare Other | Admitting: Oncology

## 2017-05-10 ENCOUNTER — Ambulatory Visit
Admission: RE | Admit: 2017-05-10 | Discharge: 2017-05-10 | Disposition: A | Discharge status: Home / Self Care | Payer: Medicare Other | Source: Ambulatory Visit | Attending: Radiation Oncology | Admitting: Radiation Oncology

## 2017-05-10 ENCOUNTER — Encounter: Payer: Self-pay | Admitting: Radiation Oncology

## 2017-05-10 ENCOUNTER — Other Ambulatory Visit (HOSPITAL_BASED_OUTPATIENT_CLINIC_OR_DEPARTMENT_OTHER): Payer: Medicare Other

## 2017-05-10 ENCOUNTER — Encounter: Payer: Self-pay | Admitting: Oncology

## 2017-05-10 VITALS — BP 98/70 | HR 97 | Temp 98.0°F | Wt 164.6 lb

## 2017-05-10 DIAGNOSIS — R634 Abnormal weight loss: Secondary | ICD-10-CM

## 2017-05-10 DIAGNOSIS — C023 Malignant neoplasm of anterior two-thirds of tongue, part unspecified: Secondary | ICD-10-CM

## 2017-05-10 DIAGNOSIS — Z931 Gastrostomy status: Secondary | ICD-10-CM | POA: Diagnosis not present

## 2017-05-10 DIAGNOSIS — R5383 Other fatigue: Secondary | ICD-10-CM | POA: Diagnosis present

## 2017-05-10 DIAGNOSIS — K117 Disturbances of salivary secretion: Secondary | ICD-10-CM

## 2017-05-10 DIAGNOSIS — Z510 Encounter for antineoplastic radiation therapy: Secondary | ICD-10-CM | POA: Diagnosis not present

## 2017-05-10 LAB — CBC WITH DIFFERENTIAL/PLATELET
BASO%: 0.7 % (ref 0.0–2.0)
BASOS ABS: 0 10*3/uL (ref 0.0–0.1)
EOS%: 3.9 % (ref 0.0–7.0)
Eosinophils Absolute: 0.2 10*3/uL (ref 0.0–0.5)
HEMATOCRIT: 35.9 % (ref 34.8–46.6)
HGB: 12.2 g/dL (ref 11.6–15.9)
LYMPH#: 0.8 10*3/uL — AB (ref 0.9–3.3)
LYMPH%: 18.3 % (ref 14.0–49.7)
MCH: 31.1 pg (ref 25.1–34.0)
MCHC: 34 g/dL (ref 31.5–36.0)
MCV: 91.6 fL (ref 79.5–101.0)
MONO#: 0.6 10*3/uL (ref 0.1–0.9)
MONO%: 13.4 % (ref 0.0–14.0)
NEUT#: 2.6 10*3/uL (ref 1.5–6.5)
NEUT%: 63.7 % (ref 38.4–76.8)
PLATELETS: 236 10*3/uL (ref 145–400)
RBC: 3.92 10*6/uL (ref 3.70–5.45)
RDW: 13.1 % (ref 11.2–14.5)
WBC: 4.1 10*3/uL (ref 3.9–10.3)

## 2017-05-10 LAB — COMPREHENSIVE METABOLIC PANEL
ALK PHOS: 109 U/L (ref 40–150)
ALT: 83 U/L — ABNORMAL HIGH (ref 0–55)
ANION GAP: 7 meq/L (ref 3–11)
AST: 44 U/L — ABNORMAL HIGH (ref 5–34)
Albumin: 3.7 g/dL (ref 3.5–5.0)
BILIRUBIN TOTAL: 0.42 mg/dL (ref 0.20–1.20)
BUN: 11.2 mg/dL (ref 7.0–26.0)
CALCIUM: 10.2 mg/dL (ref 8.4–10.4)
CHLORIDE: 99 meq/L (ref 98–109)
CO2: 27 mEq/L (ref 22–29)
CREATININE: 0.9 mg/dL (ref 0.6–1.1)
EGFR: 78 mL/min/{1.73_m2} — ABNORMAL LOW (ref >90)
Glucose: 95 mg/dl (ref 70–140)
Potassium: 4.2 mEq/L (ref 3.5–5.1)
Sodium: 133 mEq/L — ABNORMAL LOW (ref 136–145)
Total Protein: 7 g/dL (ref 6.4–8.3)

## 2017-05-10 NOTE — Progress Notes (Signed)
SYMPTOM MANAGEMENT CLINIC    Chief Complaint: Fatigue and thick oral secretions  HPI:  Sabrina Romero 51 y.o. 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 The patient is seen in symptom management today for fatigue and thick oral secretions at the request of radiation oncology. The patient reports that she has been more tired since receiving radiation. She does not leave the house much, but was able to go out yesterday to dinner with her father for Father's Day. The patient's biggest issue is thick oral secretions. Sometimes this makes her cough and gag. The patient has a suction device at home. She denies having any fevers or chills. Denies chest pain and shortness of breath. She has nausea only with her thick secretions, but not vomiting. She states that she has antiemetics at home to use if needed. Denies abdominal pain. The patient has mild pain to her face, but is not taking any pain medication. The patient does have oxycodone at home if needed. The patient is currently taking in 3 cans of Osmolite via PEG tube. Her husband states that she puts 2000 mL of water through her PEG tube daily. Weight has been reviewed and her weight is down by 1 pound in one week; she is down approximate 10 pounds in one month's time. She overall feels that she is tolerating her treatment well with the exception of the fatigue and the thick oral secretions.  Oncology History   Outside PET scan showed no evidence of disease. She doesn't some abnormal uptake on the left side of the tongue.     Cancer of anterior two-thirds of tongue ([place])   09/23/2015 Initial Diagnosis    Initial diagnosis in [place]. 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 She had biopsy which show moderately differentiated squamous cell carcinoma, at least 2.5 mm depth.      10/15/2015 Imaging    Outside CT scan showed no evidence of other disease      10/28/2015 Surgery    She had left oral tongue partial glossectomy which revealed 2.5 cm squamous cell carcinoma, highly infiltrated and ulcerated, moderate to poorly differentiated with lymphovascular invasion and perineural invasion. Margins are negative. The patient refused adjuvant treatment and wanted only surveillance follow-up imaging      10/28/2015 Pathology Results    Surgical pathology revealed squamous cell carcinoma, [phone] cm, 1.1 cm thick, ulcerated, evidence of lymphovascular invasion and perineural invasion, moderate to poorly differentiated, margins were close at 0.6 mm posteriorly      12/10/2015 PET scan    Outside PET scan showed no evidence of disease but there are abnormal uptake      03/27/2016 PET scan    Outside PET/CT scan showed diffuse physiologic activity in the oral cavity and oropharynx. No evidence of distant metastatic disease.      07/07/2016 PET scan    Outside PET/CT scan show possibility of presence of recurrent tumor.      07/16/2016 Imaging    She had outside MRI of the neck which show abnormal enhancement within the left inferior lateral tongue, extending to partially involve the surrounding tissue.      08/21/2016 PET scan    Outside PET/CT scan show activity along the left lateral tongue and adjacent soft tissue      11/30/2016 Miscellaneous    She has symptoms of pathology and discomfort near the TMJ and along the left side of her tongue.  12/07/2016 PET scan    Outside PET/CT scan show focal uptake in the left lower mild/inferior aspect of the left tongue and lymphadenopathy in the anterior portion of the left submandibular gland involving an abnormal lymph node.      12/17/2016 Procedure    She underwent ultrasound-guided left submandibular lymph node biopsy      12/17/2016 Pathology Results    Biopsy confirmed metastatic squamous cell carcinoma      01/07/2017 Imaging    CT neck: 1. Appearance highly suspicious for recurrent tumor  in the substance of the left tongue, size estimated at 28 x 22 x 23 mm and progressed since 07/16/2016. See series 3, image 36, coronal image 32, and sagittal image 54 2. Suspicious left level Ib and left level IIa lymph nodes, possibly with extracapsular extension at the latter. 3. Follow-up PET-CT would be complementary. 4. Architectural distortion anterior and posterior to the left submandibular gland which may be mildly obstructed and seems to correspond to the palpable abnormality      02/16/2017 Surgery    She underwent L partial glossectomy, selective neck dissection with SCM flap, and tracheotomy. The procedure was tolerated without complication. She resumed feeds via dobhoff tube. The trach was downgraded to cuffless, and then decannulated on 02/22/17 without difficulty.         02/16/2017 Pathology Results    A.LYMPH NODES, LEFT NECK LEVEL 2B, DISSECTION:  Four benign lymph nodes, negative for malignancy (0/4).  B.SOFT TISSUE, CAROTID BIFURCATION, EXCISION:  Involved by invasive squamous cell carcinoma. Perineural invasion identified.  C.TONGUE AND NECK CONTENTS,LEFT, PARTIAL GLOSSECTOMY AND NECK DISSECTION:  Invasive squamous cell carcinoma, moderately-poorly differentiated, 2.5 cm in greatest dimension. Perineural invasion identified. No lymphvascular invasion identified. Base of tongue and lateral margins are positive for invasive carcinoma. One of three lymph nodes positive for metastatic carcinoma (1/3). The metastatic deposit is 0.9 cm. Extranodal extension present. Pathologic staging: [id number] See cancer protocol.      02/21/2017 Procedure    She underwent laproscopic gastrostomy tube placement for permanent enteral feeding access.          03/18/2017 Imaging    CT chest: Interval development of tiny bilateral pulmonary nodules. Given the history of head and neck cancer, close follow-up recommended.        03/18/2017 Imaging    CT neck: Post treatment changes status post partial LEFT glossectomy and modified radical neck resection for recurrent squamous cell carcinoma, 1 month ago. The reported residual tumor surrounding the LEFT carotid bifurcation is inseparable from postoperative fluid/scarring. Continued surveillance warranted       Review of Systems  Constitutional: Positive for malaise/fatigue and weight loss. Negative for chills and fever.  HENT: Positive for sore throat. Negative for congestion, ear discharge, ear pain, hearing loss, nosebleeds, sinus pain and tinnitus.   Eyes: Negative.   Respiratory: Negative for hemoptysis, shortness of breath, wheezing and stridor.   Cardiovascular: Negative.   Gastrointestinal: Positive for nausea. Negative for abdominal pain, constipation, diarrhea and vomiting.  Genitourinary: Negative.   Musculoskeletal: Negative.   Skin: Negative.   Neurological: Negative.   Endo/Heme/Allergies: Negative.   Psychiatric/Behavioral: Negative.     Past Medical History:  Diagnosis Date  . Hypertension   . Multiple sclerosis ([place])   . Seasonal allergies   . Squamous cell cancer of tongue ([place]) 10/08/2015    Past Surgical History:  Procedure Laterality Date  . ABDOMINAL HYSTERECTOMY    . KNEE SURGERY Bilateral   . PARTIAL GLOSSECTOMY Left 10/28/2015  Left oral Tongue partial glossectomy.   . TUBAL LIGATION    . WISDOM TOOTH EXTRACTION      has Cancer of anterior two-thirds of tongue ([place]); Multiple sclerosis ([place]); Hypertension; Cancer associated pain; and Multiple lung nodules on CT on her problem list.    is allergic to sulfa antibiotics.  Allergies as of 05/10/2017      Reactions   Sulfa Antibiotics Hives      Medication List       Accurate as of 05/10/17 10:27 AM. Always use your most recent med list.          atropine 1 % ophthalmic solution Apply 2 drops under tongue 15 min before each radiotherapy appointment for thick  secretions. Do not exceed 2 drops/day. Use instead of spray   bacitracin 500 UNIT/GM ointment   baclofen 10 MG tablet Commonly known as:  LIORESAL TAKE UP TO 4 TABLETS DAILY, IN DIVIDED DOSES.   cetirizine 10 MG tablet Commonly known as:  ZYRTEC Take 10 mg by mouth.   COPAXONE 40 MG/ML Sos[place] Generic drug:  Glatiramer Acetate Inject 40 mg into the skin 3 (three) times a week.   emollient cream Commonly known as:  BIAFINE Apply topically as needed.   feeding supplement (OSMOLITE 1.5 CAL) Liqd Give 1 1/2 cans 4 times per day.  Flush with 66m of water before and after feeding.   Send bolus tube feeding supplies.   FISH OIL PO Take by mouth   ipratropium 0.03 % nasal spray Commonly known as:  ATROVENT Apply two sprays sublingually up to QID for thick saliva. Apply 30 minutes before radiotherapy.   lisinopril 20 MG tablet Commonly known as:  PRINIVIL,ZESTRIL Take 20 mg by mouth daily.   LORazepam 2 MG/ML concentrated solution Commonly known as:  ATIVAN Place 0.3 mLs (0.6 mg total) into feeding tube every 8 (eight) hours as needed (nausea).   metoprolol tartrate 25 MG tablet Commonly known as:  LOPRESSOR Take 25 mg by mouth.   MULTI-VITAMINS Tabs Take by mouth   naproxen 500 MG tablet Commonly known as:  NAPROSYN   ondansetron 8 MG tablet Commonly known as:  ZOFRAN Take 1 tablet (8 mg total) by mouth every 8 (eight) hours as needed for nausea. for nausea   oxyCODONE 5 MG/5ML solution Commonly known as:  ROXICODONE Take 10 mLs (10 mg total) by mouth every 4 (four) hours as needed for severe pain.   promethazine 6.25 MG/5ML syrup Commonly known as:  PHENERGAN Place 20 mLs (25 mg total) into feeding tube every 6 (six) hours as needed for nausea or vomiting.   scopolamine 1 MG/3DAYS Commonly known as:  TRANSDERM-SCOP Place 1 patch (1.5 mg total) onto the skin every 3 (three) days. This may help thick saliva/secretions.   sodium fluoride 1.1 % Gel dental  gel Commonly known as:  FLUORISHIELD Instill one drop of gel per tooth space of fluoride tray. Place over teeth for 5 minutes. Remove. Spit out excess. Repeat nightly.   sodium fluoride 1.1 % Crea dental cream Commonly known as:  PREVIDENT 5000 PLUS Apply to tooth brush. Brush teeth for 2 minutes. Spit out excess. Repeat nightly.   Vitamin D3 2000 units capsule Take by mouth        PHYSICAL EXAMINATION  Oncology Vitals 05/10/2017 05/03/2017  Height - -  Weight 74.662 kg 75.206 kg  Weight (lbs) 164 lbs 10 oz 165 lbs 13 oz  BMI (kg/m2) 28.25 kg/m2 28.46 kg/m2  Temp 98 98.6  Pulse 97 101  Resp - 20  SpO2 - 100  BSA (m2) 1.84 m2 1.84 m2   BP Readings from Last 2 Encounters:  05/10/17 98/70  05/03/17 130/66    Physical Exam  Constitutional: She is oriented to person, place, and time and well-developed, well-nourished, and in no distress. No distress.  HENT:  Head: Normocephalic and atraumatic.  Thick oral secretions noted in her mouth. No thrush noted.  Eyes: Conjunctivae are normal. Right eye exhibits no discharge. Left eye exhibits no discharge. No scleral icterus.  Neck: Normal range of motion. Neck supple. No tracheal deviation present. No thyromegaly present.  Cardiovascular: Normal rate, regular rhythm and intact distal pulses.   Pulmonary/Chest: Breath sounds normal. No respiratory distress. She has no wheezes. She has no rales.  Abdominal: Soft. Bowel sounds are normal. She exhibits no distension. There is no tenderness.  PEG tube in place without drainage or redness.  Musculoskeletal: She exhibits no edema.  Lymphadenopathy:    She has no cervical adenopathy.  Neurological: She is alert and oriented to person, place, and time. She exhibits normal muscle tone.  Skin: Skin is warm and dry. No rash noted. She is not diaphoretic. No erythema. No pallor.  Psychiatric: Mood, memory, affect and judgment normal.  Patient declined vital signs in medical oncology. Vital signs  reviewed from radiation oncology this morning. Temperature 98.0, Blood pressure 98/70, pulse 97, weight 164 pounds.  LABORATORY DATA:. No visits with results within 3 Day(s) from this visit.  Latest known visit with results is:  Hospital Outpatient Visit on 05/03/2017  Component Date Value Ref Range Status  . Sodium 05/03/2017 137  136 - 145 mEq/L Final  . Potassium 05/03/2017 4.3  3.5 - 5.1 mEq/L Final  . Chloride 05/03/2017 101  98 - 109 mEq/L Final  . CO2 05/03/2017 28  22 - 29 mEq/L Final  . Glucose 05/03/2017 102  70 - 140 mg/dl Final   Glucose reference range is for nonfasting patients. Fasting glucose reference range is 70- 100.  Marland Kitchen BUN 05/03/2017 12.2  7.0 - 26.0 mg/dL Final  . Creatinine 05/03/2017 0.8  0.6 - 1.1 mg/dL Final  . Calcium 05/03/2017 10.5* 8.4 - 10.4 mg/dL Final  . Anion Gap 05/03/2017 9  3 - 11 mEq/L Final  . EGFR 05/03/2017 85* >90 ml/min/1.73 m2 Final   eGFR is calculated using the CKD-EPI Creatinine Equation (2009)    RADIOGRAPHIC STUDIES: No results found.  ASSESSMENT/PLAN:    No problem-specific Assessment & Plan notes found for this encounter.  This is a 51 year old female with cancer of the anterior two thirds of her tongue. She is currently receiving radiation under the care of Dr. Isidore Moos. She is overall tolerating her radiation well.   CBC was reviewed with the patient and remain stable. Her CMET had not resulted while she was here. The patient stated that she needed to get home and could not wait for results. She does not appear dehydrated on her exam today. We will hold off on IV fluids. We can certainly consider IV fluids if she develops difficulty with intake through her PEG tube or vomiting and diarrhea. Will review her CMET and contact her if abnormal.  Reviewed dietitian recommendations with her. The most recent note recommends that she increase to 6 cans of Osmolite daily. She has not been doing this routinely and I have encouraged her to do so  and to follow-up with the dietitian as scheduled.  Discussed with the patient that for  her oral secretions she may use Robitussin 10 mL every 4 hours to thin the secretions. Continue suction.  Recommend that she use her oxycodone that she has at home as needed if her pain worsens. She was also encouraged to use her anti-emetics that she has at home if she develops nausea.  Encouraged patient to perform swallowing exercises as outlined by speech therapy.  The patient will follow-up with Dr. Alvy Bimler after her radiation is complete. She will follow-up in symptom management as needed.   Patient stated understanding of all instructions; and was in agreement with this plan of care. The patient knows to call the clinic with any problems, questions or concerns.    Mikey Bussing, NP 05/10/2017

## 2017-05-10 NOTE — Patient Instructions (Signed)
May use Robitussin (guiafenesin) 10 ml every 4 hours through the PEG tube to thin your secretions.

## 2017-05-10 NOTE — Progress Notes (Signed)
Sabrina Romero presents for her 22nd fraction of radiation to her Tongue. She reports pain to the left side of her face a 6/10. She is having fatigue. She is not eating much orally except sips of water. She is instilling 3 cans of osmolite daily, and 2000 ml of water. She occasionally mixes yogurt, apple juice through her tube. She has thick saliva which causes gagging at time. She is using a scopalamine and baking soda rinses. She denies nausea and tells me that the ativan is working well. She has a cough and throat irritation. She asks about using muccinex to help dry up her secretions.   BP 98/70   Pulse 97   Temp 98 F (36.7 C)   Wt 164 lb 9.6 oz (74.7 kg)   BMI 28.25 kg/m    Wt Readings from Last 3 Encounters:  05/10/17 164 lb 9.6 oz (74.7 kg)  05/03/17 165 lb 12.8 oz (75.2 kg)  04/26/17 170 lb 12.8 oz (77.5 kg)

## 2017-05-10 NOTE — Progress Notes (Signed)
   Weekly Management Note:  Outpatient    ICD-10-CM   1. Cancer of anterior two-thirds of tongue ([facility]) C02.3     Current Dose:  44 Gy  Projected Dose: 66 Gy   Narrative:  The patient presents for under treatment assessment. She is accompanied by her husband today. The chart was checked.  Taking Advil for left facial pain - no other pain meds taken currently.  Using suction device at home.  No new complaints.  Fatigued, sipping water, taking most hydration and nutrition by tube, with stable weight (down 1 lb).  Physical Findings:  weight is 164 lb 9.6 oz (74.7 kg). Her temperature is 98 F (36.7 C). Her blood pressure is 98/70 and her pulse is 97.   Wt Readings from Last 3 Encounters:  05/10/17 164 lb 9.6 oz (74.7 kg)  05/03/17 165 lb 12.8 oz (75.2 kg)  04/26/17 170 lb 12.8 oz (77.5 kg)  Patient presents in a wheelchair. NAD She has copious saliva in the mouth but only mild mucositis over the tongue. No oral thrush. Skin intact over neck.    CBC    Component Value Date/Time   WBC 9.0 04/26/2017 1051   RBC 3.78 04/26/2017 1051   HGB 11.8 04/26/2017 1051   HCT 34.9 04/26/2017 1051   PLT 260 04/26/2017 1051   MCV 92.2 04/26/2017 1051   MCH 31.2 04/26/2017 1051   MCHC 33.8 04/26/2017 1051   RDW 13.5 04/26/2017 1051   LYMPHSABS 0.7 (L) 04/26/2017 1051   MONOABS 0.7 04/26/2017 1051   EOSABS 0.1 04/26/2017 1051   BASOSABS 0.0 04/26/2017 1051   CMP     Component Value Date/Time   NA 137 05/03/2017 1052   K 4.3 05/03/2017 1052   CO2 28 05/03/2017 1052   GLUCOSE 102 05/03/2017 1052   BUN 12.2 05/03/2017 1052   CREATININE 0.8 05/03/2017 1052   CALCIUM 10.5 (H) 05/03/2017 1052   PROT 6.9 04/26/2017 1051   ALBUMIN 3.6 04/26/2017 1051   AST 51 (H) 04/26/2017 1051   ALT 71 (H) 04/26/2017 1051   ALKPHOS 132 04/26/2017 1051   BILITOT 0.53 04/26/2017 1051     Impression:  The patient is tolerating radiotherapy.  Multiple co morbidities, without contact in a PCP's clinic  yet.  Plan:  Continue radiotherapy as planned.  Scheduled to see med/onc for symptom management weekly... She has multiple comorbidities and is relatively fragile.  Take fluids and nutrition as tolerated.   Yankauer suction delivered by home health.  Pt may try mucinex to loosen secretions if she wishes. ______________________________   Eppie Gibson, M.D.  This document serves as a record of services personally performed by Eppie Gibson, MD. It was created on her behalf by Maryla Morrow, a trained medical scribe. The creation of this record is based on the scribe's personal observations and the provider's statements to them. This document has been checked and approved by the attending provider.

## 2017-05-11 ENCOUNTER — Ambulatory Visit
Admission: RE | Admit: 2017-05-11 | Discharge: 2017-05-11 | Disposition: A | Discharge status: Home / Self Care | Payer: Medicare Other | Source: Ambulatory Visit | Attending: Radiation Oncology | Admitting: Radiation Oncology

## 2017-05-11 DIAGNOSIS — Z510 Encounter for antineoplastic radiation therapy: Secondary | ICD-10-CM | POA: Diagnosis not present

## 2017-05-12 ENCOUNTER — Encounter: Payer: Self-pay | Admitting: *Deleted

## 2017-05-12 ENCOUNTER — Ambulatory Visit
Admission: RE | Admit: 2017-05-12 | Discharge: 2017-05-12 | Disposition: A | Discharge status: Home / Self Care | Payer: Medicare Other | Source: Ambulatory Visit | Attending: Radiation Oncology | Admitting: Radiation Oncology

## 2017-05-12 ENCOUNTER — Ambulatory Visit: Payer: Medicare Other | Admitting: Nutrition

## 2017-05-12 DIAGNOSIS — Z510 Encounter for antineoplastic radiation therapy: Secondary | ICD-10-CM | POA: Diagnosis not present

## 2017-05-12 DIAGNOSIS — C023 Malignant neoplasm of anterior two-thirds of tongue, part unspecified: Secondary | ICD-10-CM

## 2017-05-12 MED ORDER — OSMOLITE 1.5 CAL PO LIQD: ORAL | 0 refills | Status: DC

## 2017-05-12 NOTE — Progress Notes (Addendum)
Nutrition follow-up completed with patient and husband after radiation therapy for recurrent tongue cancer. Weight decreased and documented as 164.6 pounds on June 18. Patient reports she only tolerates 4 ounces of Osmolite 1.5 - 6 times a day giving 1066 cal, 45 grams protein, and 540 mL free water or 50% of estimated needs. Patient complains of thick saliva. She is using baking soda saltwater and suction as needed. She continues over-the-counter Pepcid for reflux.   Estimated nutrition needs: 1925-2300 calories, 96-115 grams protein, 2.3 L fluid.  Nutrition diagnosis:  Inadequate oral intake continues. Severe malnutrition continues.  Intervention: Patient agrees to try continuous feeding via PEG for nutrition support. Patient will continue Osmolite 1.5. Patient prefers to run continuous feedings from 9 AM until 11:30 PM. Will start continuous feedings at 60 mL an hour over 14.5 hours.  She will increase 10 mL every 4 hours to goal rate of 99 cc over 14.5 hours.  She will flush feeding tube with 60 mL free water before and after continuous feedings.  In addition, she will give 240 mL free water 5 times a day. This provides 2130 cal, 89.4 g protein, and 2460 mL free water. Patient and husband were educated.  Teach back method used.  Written information was provided and contact information was given. [facility] was notified.  Monitoring, evaluation, goals:  Patient will tolerate continuous tube feedings at goal rate to meet greater than 90% of estimated nutrition needs. Will add supplemental protein once patient that achieves goal rate feeding.  Next visit: Wednesday, June 27, after radiation therapy.  **Disclaimer: This note was dictated with voice recognition software. Similar sounding words can inadvertently be transcribed and this note may contain transcription errors which may not have been corrected upon publication of note.**

## 2017-05-12 NOTE — Progress Notes (Signed)
Oncology Nurse Navigator Documentation  Met with Ms. Kapuscinski during RT to assist with suctioning.  She was accompanied by her husband. She required minimal suctioning today.   She was wearing Scopolamine patch which she reports is helping minimize secretions. Her husband reported she is using salt water/baking soda rinse several times daily which helps to manage secretions.  [name], RN, BSN, [place] Oncology Nurse [place] at [place] [phone]

## 2017-05-13 ENCOUNTER — Ambulatory Visit
Admission: RE | Admit: 2017-05-13 | Discharge: 2017-05-13 | Disposition: A | Discharge status: Home / Self Care | Payer: Medicare Other | Source: Ambulatory Visit | Attending: Radiation Oncology | Admitting: Radiation Oncology

## 2017-05-13 ENCOUNTER — Encounter: Payer: Self-pay | Admitting: Nutrition

## 2017-05-13 DIAGNOSIS — Z510 Encounter for antineoplastic radiation therapy: Secondary | ICD-10-CM | POA: Diagnosis not present

## 2017-05-13 MED ORDER — OSMOLITE 1.5 CAL PO LIQD: ORAL | 0 refills | Status: AC

## 2017-05-13 NOTE — Addendum Note (Signed)
Addended by: Karie Mainland on: 05/13/2017 02:23 PM   Modules accepted: Orders

## 2017-05-13 NOTE — Progress Notes (Signed)
A user error has taken place: encounter opened in error, closed for administrative reasons.

## 2017-05-14 ENCOUNTER — Ambulatory Visit
Admission: RE | Admit: 2017-05-14 | Discharge: 2017-05-14 | Disposition: A | Discharge status: Home / Self Care | Payer: Medicare Other | Source: Ambulatory Visit | Attending: Radiation Oncology | Admitting: Radiation Oncology

## 2017-05-14 DIAGNOSIS — Z510 Encounter for antineoplastic radiation therapy: Secondary | ICD-10-CM | POA: Diagnosis not present

## 2017-05-15 ENCOUNTER — Ambulatory Visit: Payer: Medicare Other

## 2017-05-17 ENCOUNTER — Other Ambulatory Visit: Payer: Self-pay | Admitting: Radiation Oncology

## 2017-05-17 ENCOUNTER — Encounter: Payer: Self-pay | Admitting: *Deleted

## 2017-05-17 ENCOUNTER — Ambulatory Visit: Payer: Medicare Other

## 2017-05-17 ENCOUNTER — Ambulatory Visit: Admission: RE | Admit: 2017-05-17 | Payer: Medicare Other | Source: Ambulatory Visit

## 2017-05-17 DIAGNOSIS — C023 Malignant neoplasm of anterior two-thirds of tongue, part unspecified: Secondary | ICD-10-CM

## 2017-05-17 MED ORDER — LIDOCAINE VISCOUS 2 % MT SOLN: OROMUCOSAL | 5 refills | Status: AC

## 2017-05-17 NOTE — Progress Notes (Signed)
Oncology Nurse Navigator Documentation  Met with Ms. Knies during RT to provide suction support.  She was unable to complete tmt d/t copious secretions despite continuation of Scopolamine patch and administration of atropine prior to tmt.  [name], RN, BSN, [place] [facility] Oncology Nurse [place] at [place] [phone]

## 2017-05-18 ENCOUNTER — Ambulatory Visit
Admission: RE | Admit: 2017-05-18 | Discharge: 2017-05-18 | Disposition: A | Discharge status: Home / Self Care | Payer: Medicare Other | Source: Ambulatory Visit | Attending: Radiation Oncology | Admitting: Radiation Oncology

## 2017-05-18 DIAGNOSIS — Z510 Encounter for antineoplastic radiation therapy: Secondary | ICD-10-CM | POA: Diagnosis not present

## 2017-05-19 ENCOUNTER — Ambulatory Visit
Admission: RE | Admit: 2017-05-19 | Discharge: 2017-05-19 | Disposition: A | Discharge status: Home / Self Care | Payer: Medicare Other | Source: Ambulatory Visit | Attending: Radiation Oncology | Admitting: Radiation Oncology

## 2017-05-19 ENCOUNTER — Encounter: Payer: Self-pay | Admitting: *Deleted

## 2017-05-19 ENCOUNTER — Ambulatory Visit: Payer: Medicare Other | Admitting: Nutrition

## 2017-05-19 DIAGNOSIS — Z510 Encounter for antineoplastic radiation therapy: Secondary | ICD-10-CM | POA: Diagnosis not present

## 2017-05-19 NOTE — Progress Notes (Signed)
Oncology Nurse Navigator Documentation  Met with Ms. Dobler during RT to provide support with suctioning.  She was accompanied by her husband and sister, Arbie Cookey. She completed treatment needing moderate suctioning.   They understand I can be contacted with needs/concerns.  [name], RN, BSN, [place] Oncology Nurse [place] at [place] [phone]

## 2017-05-19 NOTE — Progress Notes (Signed)
Nutrition follow-up completed with patient husband, and sister after radiation therapy for recurrent tongue cancer. Last weight documented 164.6 pounds June 18. Patient did receive continuous feeding pump and has used it several times. She still is only totaling 3 bottles of Osmolite 1.5 daily. Patient feels like she does not need more nutrition because she is immobile. Patient also complains of feeling very full after tube feeding.  Estimated nutrition needs: 1925-2000 calories, 96-115 grams protein, 2.3 L fluid.  Nutrition diagnosis: Inadequate oral intake continues. Severe malnutrition.  Continues.  Intervention: Explained the reasoning behind adequate calories and protein to support healing. Patient to continue Osmolite 1.5 continuous feeding and try to workup to a total of 4 bottles daily.  Keeping in mind goal rate of 6 bottles daily. Recommend Reglan to help motility. Teach back method used. If patient does not tolerate Osmolite 1.5, I will consider switching to vital 1.5 for easier tolerance.  Monitoring, evaluation, goals: Patient will tolerate continuous tube feedings at goal rate meet greater than 90% of estimated estimated nutrition needs and support weight maintenance.  Next visit: Wednesday, July 11.  **Disclaimer: This note was dictated with voice recognition software. Similar sounding words can inadvertently be transcribed and this note may contain transcription errors which may not have been corrected upon publication of note.**

## 2017-05-20 ENCOUNTER — Encounter: Payer: Self-pay | Admitting: *Deleted

## 2017-05-20 ENCOUNTER — Ambulatory Visit: Admission: RE | Admit: 2017-05-20 | Payer: Medicare Other | Source: Ambulatory Visit

## 2017-05-20 NOTE — Progress Notes (Signed)
Oncology Nurse Navigator Documentation  Met with Sabrina Romero to facilitate PRN suctioning of oral secretions.  She was accompanied by her husband and sister. She was unable to complete treatment despite frequent suctioning of copious secretions. Sister and husband noted recent appearance of firm swelling under chin which patient indicated was slightly painful to touch.  I offered this is likely treatment-related, can be further assessed by Dr. Isidore Moos during next [facility] PUT.  They voiced satisfaction with plan.  [name], RN, BSN, [place] [facility]ck Oncology Nurse [place] at [place] [phone]

## 2017-05-21 ENCOUNTER — Telehealth: Payer: Self-pay | Admitting: Radiation Oncology

## 2017-05-21 ENCOUNTER — Telehealth: Payer: Self-pay | Admitting: *Deleted

## 2017-05-21 ENCOUNTER — Ambulatory Visit: Payer: Medicare Other

## 2017-05-21 NOTE — Telephone Encounter (Signed)
[name], RN, our Head and Neck Oncology Navigator received a call from Ms. Hefel who had me speak with her sister, Arbie Cookey.    Arbie Cookey reported Gerald Stabs was awake most of the night d/t persistent cough and copious amounts of oral secretions which she suctioned continuously.  She was unable to lie down because of secretions.    She is taking Mucinex which contains 200 mg guaifenesin, wearing Scopolamine patch, apply atropine sublingually BID including prior to RT. Chris asked for additional guidance on what she can do to control secretions.  She indicated she does not plan to come for RT today because she cannot recline without choking.  I told the patient and her sister by phone to try  1-2 drops of 1% ophthalmic atropine sulfate every 4 to 6 hours (not exceeding 8 drops daily).   Continue frequent suctioning and elevate head in recliner while sleeping.  May increase mucinex as dosing allowed on label of this OTC med.  She is also affected by seasonal allergies, and out of caution, I suggested she make sure with her PCP that any new OTC meds for her allergies are safe to take given the patient's other medical issues. -----------------------------------  Eppie Gibson, MD

## 2017-05-21 NOTE — Telephone Encounter (Signed)
Oncology Nurse Navigator Documentation  Received call from Sabrina Romero who had me speak with her sister, Sabrina Romero.    Sabrina Romero reported Sabrina Romero was awake most of the night d/t persistent cough and copious amounts of oral secretions which she suctioned continuously.  She was unable to lie down because of secretions.    She is taking Mucinex which contains 200 mg guaifenesin, wearing Scopolamine patch, apply atropine sublingually BID including prior to RT.  Sabrina Romero asked for additional guidance on what she can do to control secretions.  She indicated she does not plan to come for RT today because she cannot recline without choking. Dr. Isidore Moos informed.  Sabrina Orem, RN, BSN, [place] [facility] Oncology Nurse [place] at [place] [phone]

## 2017-05-24 ENCOUNTER — Ambulatory Visit
Admission: RE | Admit: 2017-05-24 | Discharge: 2017-05-24 | Disposition: A | Discharge status: Home / Self Care | Payer: Medicare Other | Source: Ambulatory Visit | Attending: Radiation Oncology | Admitting: Radiation Oncology

## 2017-05-24 ENCOUNTER — Ambulatory Visit: Payer: Medicare Other

## 2017-05-24 ENCOUNTER — Encounter: Payer: Self-pay | Admitting: *Deleted

## 2017-05-24 ENCOUNTER — Other Ambulatory Visit: Payer: Self-pay | Admitting: Radiation Oncology

## 2017-05-24 DIAGNOSIS — C023 Malignant neoplasm of anterior two-thirds of tongue, part unspecified: Secondary | ICD-10-CM

## 2017-05-24 DIAGNOSIS — Z510 Encounter for antineoplastic radiation therapy: Secondary | ICD-10-CM | POA: Diagnosis not present

## 2017-05-24 MED ORDER — METOCLOPRAMIDE HCL 5 MG/5ML PO SOLN: 10.0000 mg | Freq: Four times a day (QID) | ORAL | 3 refills | Status: AC | PRN

## 2017-05-24 NOTE — Progress Notes (Signed)
Oncology Nurse Navigator Documentation  Met with Ms. Broecker to provide PRN suctioning during RT.  She was accompanied by her sister Arbie Cookey Oral secretions were copious. She required two attempts to finish treatment but was able to complete.  [name], RN, BSN, [place] [facility] Oncology Nurse [place] at [place] [phone]

## 2017-05-24 NOTE — Progress Notes (Signed)
A user error has taken place: encounter opened in error, closed for administrative reasons.

## 2017-05-24 NOTE — Progress Notes (Signed)
.  rderror

## 2017-05-25 ENCOUNTER — Ambulatory Visit: Payer: Medicare Other

## 2017-05-25 ENCOUNTER — Encounter: Payer: Self-pay | Admitting: *Deleted

## 2017-05-25 ENCOUNTER — Ambulatory Visit
Admission: RE | Admit: 2017-05-25 | Discharge: 2017-05-25 | Disposition: A | Discharge status: Home / Self Care | Payer: Medicare Other | Source: Ambulatory Visit | Attending: Radiation Oncology | Admitting: Radiation Oncology

## 2017-05-25 DIAGNOSIS — Z510 Encounter for antineoplastic radiation therapy: Secondary | ICD-10-CM | POA: Diagnosis not present

## 2017-05-25 NOTE — Progress Notes (Signed)
Oncology Nurse Navigator Documentation  Met with Sabrina Romero during RT to assist with PRN suctioning of oral secretions.  She was accompanied by her sister. Secretions were somewhat resolved from yesterday but suctioning required before and after tmt. She understands she has 3 treatments remaining with the final treatment scheduled for Monday, 7/9.  [name], RN, BSN, [place] [facility] Oncology Nurse [place] at [place] [phone]

## 2017-05-26 ENCOUNTER — Ambulatory Visit: Payer: Medicare Other

## 2017-05-27 ENCOUNTER — Ambulatory Visit: Payer: Medicare Other

## 2017-05-27 ENCOUNTER — Ambulatory Visit
Admission: RE | Admit: 2017-05-27 | Discharge: 2017-05-27 | Disposition: A | Discharge status: Home / Self Care | Payer: Medicare Other | Source: Ambulatory Visit | Attending: Radiation Oncology | Admitting: Radiation Oncology

## 2017-05-27 DIAGNOSIS — Z510 Encounter for antineoplastic radiation therapy: Secondary | ICD-10-CM | POA: Diagnosis not present

## 2017-05-27 MED FILL — FLUORISHIELD 1.1% GEL: 1.1 % | 30 days supply | Qty: 114 | Fill #0

## 2017-05-28 ENCOUNTER — Ambulatory Visit: Payer: Medicare Other

## 2017-05-28 ENCOUNTER — Ambulatory Visit
Admission: RE | Admit: 2017-05-28 | Discharge: 2017-05-28 | Disposition: A | Discharge status: Home / Self Care | Payer: Medicare Other | Source: Ambulatory Visit | Attending: Radiation Oncology | Admitting: Radiation Oncology

## 2017-05-28 DIAGNOSIS — Z510 Encounter for antineoplastic radiation therapy: Secondary | ICD-10-CM | POA: Diagnosis not present

## 2017-05-31 ENCOUNTER — Ambulatory Visit
Admission: RE | Admit: 2017-05-31 | Discharge: 2017-05-31 | Disposition: A | Discharge status: Home / Self Care | Payer: Medicare Other | Source: Ambulatory Visit | Attending: Radiation Oncology | Admitting: Radiation Oncology

## 2017-05-31 ENCOUNTER — Encounter: Payer: Self-pay | Admitting: *Deleted

## 2017-05-31 DIAGNOSIS — Z510 Encounter for antineoplastic radiation therapy: Secondary | ICD-10-CM | POA: Diagnosis not present

## 2017-05-31 NOTE — Progress Notes (Signed)
Oncology Nurse Navigator Documentation  Met with Ms. Virgen during final RT to provide assistance with oral suctioning, offer support and to celebrate end of radiation treatment.  She was accompanied by her husband and sister. I provided husband and sister with a Certificate of Recognition for their supportive care. I provided verbal/written post-RT guidance:  Importance of keeping follow-up appts with Nutrition and SLP.  Importance of protecting treatment area from sun.  Continuation of Biafine application 2-3 times daily until supply exhausted after which  transition to OTC lotion with vitamin E. I explained that my role as navigator will continue for several more months and that I will be calling and/or joining her during follow-up visits.   I encouraged her to call me with needs/concerns.   They verbalized understanding of information provided.  [name], RN, BSN, [place] at [place] [phone]

## 2017-06-02 ENCOUNTER — Ambulatory Visit: Payer: Medicare Other | Admitting: Nutrition

## 2017-06-02 NOTE — Progress Notes (Signed)
Nutrition follow-up completed with patient and her husband status post treatment for recurrent tongue cancer. Patient is very pleased that she has finished radiation therapy. Patient reports her weight has improved, however I see no new weight documented since June 18. Patient reports she has been taking her Reglan and she feels much better. She denies feeling overly full after administering tube feeding. She has increased Osmolite 1.5-4 bottles daily. She reports she was able to tolerate one bottle Osmolite 1.5 bolus feeding twice a day and 2 bottles Osmolite 1.5 at 120 mL an hour continuous feeding yesterday.  She denies nausea, vomiting, and constipation.  4 bottles Osmolite 1.5 provides 1420 cal, 59.6 g protein, and 724 mL free water.  Revised estimated nutrition needs: 1600-1800 calories, 75-90 g protein, 1.8 L fluid.  Nutrition diagnosis: Inadequate oral intake continues. Severe malnutrition appears improved.  Intervention: Educated patient to continue Osmolite 1.5-4 bottles daily and monitor her weight. Encouraged patient to increase Osmolite 1.5-5 bottles daily to promote weight maintenance. She is to continue Reglan and antinausea medication as needed. Teach back method used.  Monitoring, evaluation, goals:  Patient will continue tube feeding as tolerated to promote weight maintenance.  Next visit: To be scheduled if needed.  **Disclaimer: This note was dictated with voice recognition software. Similar sounding words can inadvertently be transcribed and this note may contain transcription errors which may not have been corrected upon publication of note.**

## 2017-06-04 ENCOUNTER — Encounter: Payer: Self-pay | Admitting: Radiation Oncology

## 2017-06-04 NOTE — Progress Notes (Signed)
  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 She was noted to have a lot of thick saliva in her mouth, but no evidence of thrush and no significant mucositis. Skin over the neck remained intact throughout treatment.  Plan: The patient has completed radiation treatment. The patient will return to radiation oncology clinic for routine followup in one half month. I advised the patient to call or return sooner if any questions or concerns arise that are related to recovery or treatment.  -----------------------------------  Eppie Gibson, MD  This document serves as a record of services personally performed by Eppie Gibson, MD. It was created on her behalf by Maryla Morrow, a trained medical scribe. The creation of this record is based on the scribe's personal observations and the provider's statements to them. This document has been checked and approved by the attending provider.

## 2017-06-18 ENCOUNTER — Encounter: Payer: Self-pay | Admitting: Radiation Oncology

## 2017-06-18 ENCOUNTER — Ambulatory Visit
Admission: RE | Admit: 2017-06-18 | Discharge: 2017-06-18 | Disposition: A | Discharge status: Home / Self Care | Payer: Medicare Other | Source: Ambulatory Visit | Attending: Radiation Oncology | Admitting: Radiation Oncology

## 2017-06-18 DIAGNOSIS — C023 Malignant neoplasm of anterior two-thirds of tongue, part unspecified: Secondary | ICD-10-CM

## 2017-06-18 DIAGNOSIS — Z510 Encounter for antineoplastic radiation therapy: Secondary | ICD-10-CM | POA: Diagnosis not present

## 2017-06-18 NOTE — Progress Notes (Signed)
Ms. Heacox presents for follow up of radiation completed 05/31/17 to her Tongue.    Pain issues, if any: She denies current pain. She does report sharp sudden pain to her Left Chin area occasionally Using a feeding tube?: yes, She is instilling 4 cans of osmolite daily Weight changes, if any:  05/17/17 162.2 lb 05/24/17 164.0 lb 05/31/17 169.4 lb 06/17/17 163.8 lb Swallowing issues, if any: She is swallowing liquids. She has not tried solid food. She tells she has not been released to try solids per Dr. Vicie Mutters.  Smoking or chewing tobacco? No Using fluoride trays daily? No Last ENT visit was on: Dr. Vicie Mutters last week 06/09/17 Other notable issues, if any:  She continues with thick salvia. She is using biafine to her radiation site, and will begin using vitamin E cream.   BP 122/75   Pulse 96   Temp 97.8 F (36.6 C)   Ht 5\' 4"  (1.626 m)   Wt 163 lb 12.8 oz (74.3 kg)   SpO2 100% Comment: room air  BMI 28.12 kg/m

## 2017-06-18 NOTE — Progress Notes (Signed)
Radiation Oncology         [phone]) [phone] ________________________________  Name: Sabrina Romero MRN: [id number]  Date: 06/18/2017  DOB: Apr 26, 1966  Follow-Up Visit Note  CC: Default, Provider, MD  Philomena Doheny, MD  Diagnosis and Prior Radiotherapy: Squamous cell carcinoma of left oral tongue, recurrent pT2, pN1, cM0 STAGE III (originally); now rpT4b, pN2a   ICD-10-CM   1. Cancer of anterior two-thirds of tongue ([place]) C02.3     CHIEF COMPLAINT:  Here for follow-up and surveillance of tongue cancer  Narrative:  The patient returns today for routine follow-up. She is doing well overall and denies current pain but does report a sharp sudden pain to her left chin area occasionally. She is currently using a feeding tube, instilling 4 cans of osmolite daily. She reports swallowing liquids well and has not tried solid food per Dr. Vicie Mutters - saw him earlier this month. She continues to have thick saliva. She is currently applying Biafine to her radiation site.                   ALLERGIES:  is allergic to sulfa antibiotics.  Meds: Current Outpatient Prescriptions  Medication Sig Dispense Refill  . atropine 1 % ophthalmic solution Apply 2 drops under tongue 15 min before each radiotherapy appointment for thick secretions. Do not exceed 2 drops/day. Use instead of spray 5 mL 5  . bacitracin 500 UNIT/GM ointment   0  . baclofen (LIORESAL) 10 MG tablet TAKE UP TO 4 TABLETS DAILY, IN DIVIDED DOSES.  99  . cetirizine (ZYRTEC) 10 MG tablet Take 10 mg by mouth.    . Cholecalciferol (VITAMIN D3) 2000 units capsule Take by mouth    . emollient (BIAFINE) cream Apply topically as needed.    Marland Kitchen Glatiramer Acetate (COPAXONE) 40 MG/ML SOSY Inject 40 mg into the skin 3 (three) times a week.     Marland Kitchen lisinopril (PRINIVIL,ZESTRIL) 20 MG tablet Take 20 mg by mouth daily.  0  . metoCLOPramide (REGLAN) 5 MG/5ML solution Place 10 mLs (10 mg total) into feeding tube every 6 (six) hours as needed. Take as needed to  help with digestion of tube feeds. 473 mL 3  . Multiple Vitamin (MULTI-VITAMINS) TABS Take by mouth    . Omega-3 Fatty Acids (FISH OIL PO) Take by mouth    . ondansetron (ZOFRAN) 8 MG tablet Take 1 tablet (8 mg total) by mouth every 8 (eight) hours as needed for nausea. for nausea 60 tablet 3  . ipratropium (ATROVENT) 0.03 % nasal spray Apply two sprays sublingually up to QID for thick saliva. Apply 30 minutes before radiotherapy. (Patient not taking: Reported on 05/03/2017) 30 mL 2  . lidocaine (XYLOCAINE) 2 % solution Patient: Mix 1part 2% viscous lidocaine, 1part H20. Swish & swallow 4mL of diluted mixture, 20min before meals and at bedtime, up to QID (Patient not taking: Reported on 06/18/2017) 100 mL 5  . LORazepam (ATIVAN) 2 MG/ML concentrated solution Place 0.3 mLs (0.6 mg total) into feeding tube every 8 (eight) hours as needed (nausea). (Patient not taking: Reported on 05/03/2017) 30 mL 0  . metoprolol tartrate (LOPRESSOR) 25 MG tablet Take 25 mg by mouth.    . naproxen (NAPROSYN) 500 MG tablet   0  . Nutritional Supplements (FEEDING SUPPLEMENT, OSMOLITE 1.5 CAL,) LIQD Change Osmolite 1.5 to continuous feedings via gastrostomy tube from 9:00 am until 11:30 pm. Begin at 60 mL/hr and increase 10 mL/hr q 4 hours as tolerated to goal of 99/mL/hr  over 14.5 hours.Flush with 60 mL free water before and after continuous feedings. Flush additional 240 mL free water 5 times daily. Patient needs pump, formula, and Right angle adapter to fit MIC-KEY button low profile tube. From 05/12/2017 6 Bottle 0  . oxyCODONE (ROXICODONE) 5 MG/5ML solution Take 10 mLs (10 mg total) by mouth every 4 (four) hours as needed for severe pain. (Patient not taking: Reported on 06/18/2017) 500 mL 0  . promethazine (PHENERGAN) 6.25 MG/5ML syrup Place 20 mLs (25 mg total) into feeding tube every 6 (six) hours as needed for nausea or vomiting. (Patient not taking: Reported on 06/18/2017) 473 mL 5  . scopolamine (TRANSDERM-SCOP) 1  MG/3DAYS Place 1 patch (1.5 mg total) onto the skin every 3 (three) days. This may help thick saliva/secretions. (Patient not taking: Reported on 06/18/2017) 10 patch 2  . sodium fluoride (FLUORISHIELD) 1.1 % GEL dental gel Instill one drop of gel per tooth space of fluoride tray. Place over teeth for 5 minutes. Remove. Spit out excess. Repeat nightly. (Patient not taking: Reported on 05/03/2017) 120 mL prn  . sodium fluoride (PREVIDENT 5000 PLUS) 1.1 % CREA dental cream Apply to tooth brush. Brush teeth for 2 minutes. Spit out excess. Repeat nightly. (Patient not taking: Reported on 06/18/2017) 1 Tube prn   No current facility-administered medications for this encounter.     Physical Findings: Wt Readings from Last 3 Encounters:  06/18/17 163 lb 12.8 oz (74.3 kg)  05/10/17 164 lb 9.6 oz (74.7 kg)  05/03/17 165 lb 12.8 oz (75.2 kg)    height is 5\' 4"  (1.626 m) and weight is 163 lb 12.8 oz (74.3 kg). Her temperature is 97.8 F (36.6 C). Her blood pressure is 122/75 and her pulse is 96. Her oxygen saturation is 100%. .  General: Alert and oriented, in no acute distress HEENT: Head is normocephalic. Moist mucous membranes. Mucositis in the mouth is virtually resolved. No thrush. Neck: Neck is notable for some fibrosis in the left neck post-operatively and anterior neck edema. Skin: Skin in treatment fields shows dry skin with superficial desquamation. Lymphatics: see Neck Exam  Lab Findings: Lab Results  Component Value Date   WBC 4.1 05/10/2017   HGB 12.2 05/10/2017   HCT 35.9 05/10/2017   MCV 91.6 05/10/2017   PLT 236 05/10/2017    Lab Results  Component Value Date   TSH 1.853 03/18/2017    Radiographic Findings: No results found.  Impression/Plan:    1) Head and Neck Cancer Status: Healing from radiotherapy. Apply Vitamin E lotion 2-3x per day to neck for healing.  2) Nutritional Status: Lost 6 pounds in the last 3 weeks - push intake. PEG tube: Yes  3) Risk Factors: No  tobacco use.  4) Swallowing: Swallowing liquids, has not tried solid foods.  5) Dental: Encouraged to continue regular followup with dentistry, and dental hygiene including fluoride rinses.  6) Thyroid function:  Lab Results  Component Value Date   TSH 1.853 03/18/2017    7) Other: She will continue her swallowing exercises as prescribed by SLP. She declines a PT referral at this time but will do massage therapy on her own to address early lymphedema.   8) Follow-up in 1.5 months, with CT imaging of the neck and chest prior. The patient was encouraged to call with any issues or questions before then.  I spent 20 minutes face to face with the patient and more than 50% of that time was spent in counseling and/or coordination  of care. _____________________________________   Eppie Gibson, MD  This document serves as a record of services personally performed by Eppie Gibson, MD. It was created on her behalf by Rae Lips, a trained medical scribe. The creation of this record is based on the scribe's personal observations and the provider's statements to them. This document has been checked and approved by the attending provider.

## 2017-06-19 ENCOUNTER — Other Ambulatory Visit: Payer: Self-pay | Admitting: Radiation Oncology

## 2017-06-19 DIAGNOSIS — C023 Malignant neoplasm of anterior two-thirds of tongue, part unspecified: Secondary | ICD-10-CM

## 2017-07-22 ENCOUNTER — Encounter: Payer: Self-pay | Admitting: Radiation Oncology

## 2017-07-22 NOTE — Progress Notes (Signed)
Sabrina Romero presents for follow up of radiation compelted 05/31/17 to her Tongue and neck.   Pain issues, if any: She has sharp nerve pain that shoot through her Left jaw several times daily.  Using a feeding tube?: Yes, she is using her feeding tube. She instills 3 cans of osmolite daily. When she instills more than 3 cans she feels "bloated".  Weight changes, if any:  Wt Readings from Last 3 Encounters:  07/28/17 153 lb 12.8 oz (69.8 kg)  06/18/17 163 lb 12.8 oz (74.3 kg)  05/10/17 164 lb 9.6 oz (74.7 kg)   Swallowing issues, if any: She is only drinking water by mouth Smoking or chewing tobacco? No Using fluoride trays daily? No Last ENT visit was on: 06/09/17 Dr. Vicie Mutters at [facility]. Her next appointment with him is 08/04/17.  Other notable issues, if any:  She has noted a "bump" to her Left jaw line and upper chin.  07/27/17 CT neck and chest.   BP (!) 102/59   Pulse 96   Temp 98.5 F (36.9 C)   Ht 5\' 4"  (1.626 m)   Wt 153 lb 12.8 oz (69.8 kg)   SpO2 99% Comment: room air  BMI 26.40 kg/m

## 2017-07-27 ENCOUNTER — Encounter (HOSPITAL_COMMUNITY): Payer: Self-pay

## 2017-07-27 ENCOUNTER — Ambulatory Visit (HOSPITAL_COMMUNITY)
Admission: RE | Admit: 2017-07-27 | Discharge: 2017-07-27 | Disposition: A | Discharge status: Home / Self Care | Payer: Medicare Other | Source: Ambulatory Visit | Attending: Radiation Oncology | Admitting: Radiation Oncology

## 2017-07-27 DIAGNOSIS — I700 Atherosclerosis of aorta: Secondary | ICD-10-CM | POA: Diagnosis not present

## 2017-07-27 DIAGNOSIS — I2510 Atherosclerotic heart disease of native coronary artery without angina pectoris: Secondary | ICD-10-CM | POA: Diagnosis not present

## 2017-07-27 DIAGNOSIS — Z9889 Other specified postprocedural states: Secondary | ICD-10-CM | POA: Diagnosis not present

## 2017-07-27 DIAGNOSIS — C7801 Secondary malignant neoplasm of right lung: Secondary | ICD-10-CM | POA: Insufficient documentation

## 2017-07-27 DIAGNOSIS — C7802 Secondary malignant neoplasm of left lung: Secondary | ICD-10-CM | POA: Diagnosis not present

## 2017-07-27 DIAGNOSIS — C023 Malignant neoplasm of anterior two-thirds of tongue, part unspecified: Secondary | ICD-10-CM | POA: Diagnosis present

## 2017-07-27 MED ORDER — IOPAMIDOL (ISOVUE-300) INJECTION 61%
100.0000 mL | Freq: Once | INTRAVENOUS | Status: AC | PRN
  Administered 2017-07-27: 100 mL via INTRAVENOUS

## 2017-07-27 MED ORDER — IOPAMIDOL (ISOVUE-300) INJECTION 61%
INTRAVENOUS | Status: AC
  Filled 2017-07-27: qty 100

## 2017-07-28 ENCOUNTER — Encounter: Payer: Self-pay | Admitting: Radiation Oncology

## 2017-07-28 ENCOUNTER — Encounter: Payer: Self-pay | Admitting: *Deleted

## 2017-07-28 ENCOUNTER — Ambulatory Visit
Admission: RE | Admit: 2017-07-28 | Discharge: 2017-07-28 | Disposition: A | Discharge status: Home / Self Care | Payer: Medicare Other | Source: Ambulatory Visit | Attending: Radiation Oncology | Admitting: Radiation Oncology

## 2017-07-28 DIAGNOSIS — I10 Essential (primary) hypertension: Secondary | ICD-10-CM | POA: Insufficient documentation

## 2017-07-28 DIAGNOSIS — Z510 Encounter for antineoplastic radiation therapy: Secondary | ICD-10-CM | POA: Diagnosis present

## 2017-07-28 DIAGNOSIS — Z9889 Other specified postprocedural states: Secondary | ICD-10-CM | POA: Insufficient documentation

## 2017-07-28 DIAGNOSIS — G35 Multiple sclerosis: Secondary | ICD-10-CM | POA: Diagnosis not present

## 2017-07-28 DIAGNOSIS — Z888 Allergy status to other drugs, medicaments and biological substances status: Secondary | ICD-10-CM | POA: Diagnosis not present

## 2017-07-28 DIAGNOSIS — Z79899 Other long term (current) drug therapy: Secondary | ICD-10-CM | POA: Diagnosis not present

## 2017-07-28 DIAGNOSIS — Z87891 Personal history of nicotine dependence: Secondary | ICD-10-CM | POA: Diagnosis not present

## 2017-07-28 DIAGNOSIS — Z90710 Acquired absence of both cervix and uterus: Secondary | ICD-10-CM | POA: Insufficient documentation

## 2017-07-28 DIAGNOSIS — Z882 Allergy status to sulfonamides status: Secondary | ICD-10-CM | POA: Insufficient documentation

## 2017-07-28 DIAGNOSIS — C023 Malignant neoplasm of anterior two-thirds of tongue, part unspecified: Secondary | ICD-10-CM

## 2017-07-28 DIAGNOSIS — G44201 Tension-type headache, unspecified, intractable: Secondary | ICD-10-CM | POA: Insufficient documentation

## 2017-07-28 DIAGNOSIS — R42 Dizziness and giddiness: Secondary | ICD-10-CM | POA: Insufficient documentation

## 2017-07-28 DIAGNOSIS — C029 Malignant neoplasm of tongue, unspecified: Secondary | ICD-10-CM | POA: Insufficient documentation

## 2017-07-28 HISTORY — DX: Personal history of irradiation: Z92.3

## 2017-07-28 NOTE — Progress Notes (Signed)
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 Cancer of anterior two-thirds of tongue ([place]) C02.3     CHIEF COMPLAINT:  Here for follow-up and surveillance of tongue cancer  Narrative:  The patient returns today for routine follow-up 8 weeks after completing RT. She was reviewed at ENT tumor board this morning, the head and neck region shows non-specific changes that could be explained by past surgery and radiation.  Of greater concern is her CT of the chest, this shows growing lung nodules consistent with metastases.  Overall she is feeling well. She has had some swelling to her left submandibular region, this has appeared over the past week. She does have f/u with Dr Vicie Mutters of ENT in the next week. She denies any recent fever.                   ALLERGIES:  is allergic to sulfa antibiotics.  Meds: Current Outpatient Prescriptions  Medication Sig Dispense Refill  . atropine 1 % ophthalmic solution Apply 2 drops under tongue 15 min before each radiotherapy appointment for thick secretions. Do not exceed 2 drops/day. Use instead of spray 5 mL 5  . bacitracin 500 UNIT/GM ointment   0  . baclofen (LIORESAL) 10 MG tablet TAKE UP TO 4 TABLETS DAILY, IN DIVIDED DOSES.  99  . cetirizine (ZYRTEC) 10 MG tablet Take 10 mg by mouth.    . Cholecalciferol (VITAMIN D3) 2000 units capsule Take by mouth    . Glatiramer Acetate (COPAXONE) 40 MG/ML SOSY Inject 40 mg into the skin 3 (three) times a week.     Marland Kitchen lisinopril (PRINIVIL,ZESTRIL) 20 MG tablet Take 20 mg by mouth daily.  0  . metoCLOPramide (REGLAN) 5 MG/5ML solution Place 10 mLs (10 mg total) into feeding tube every 6 (six) hours as needed. Take as needed to help with digestion  of tube feeds. 473 mL 3  . Nutritional Supplements (FEEDING SUPPLEMENT, OSMOLITE 1.5 CAL,) LIQD Change Osmolite 1.5 to continuous feedings via gastrostomy tube from 9:00 am until 11:30 pm. Begin at 60 mL/hr and increase 10 mL/hr q 4 hours as tolerated to goal of 99/mL/hr over 14.5 hours.Flush with 60 mL free water before and after continuous feedings. Flush additional 240 mL free water 5 times daily. Patient needs pump, formula, and Right angle adapter to fit MIC-KEY button low profile tube. From 05/12/2017 6 Bottle 0  . ipratropium (ATROVENT) 0.03 % nasal spray Apply two sprays sublingually up to QID for thick saliva. Apply 30 minutes before radiotherapy. (Patient not taking: Reported on 05/03/2017) 30 mL 2  . lidocaine (XYLOCAINE) 2 % solution Patient: Mix 1part 2% viscous lidocaine, 1part H20. Swish & swallow 49mL of diluted mixture, 46min before meals and at bedtime, up to QID (Patient not taking: Reported on 06/18/2017) 100 mL 5  . LORazepam (ATIVAN) 2 MG/ML concentrated solution Place 0.3 mLs (0.6 mg total) into feeding tube every 8 (eight) hours as needed (nausea). (Patient not taking: Reported on 05/03/2017) 30 mL 0  . metoprolol tartrate (LOPRESSOR) 25 MG tablet Take 25 mg by mouth.    . Multiple Vitamin (MULTI-VITAMINS) TABS Take by mouth    . naproxen (NAPROSYN) 500 MG tablet   0  .  Omega-3 Fatty Acids (FISH OIL PO) Take by mouth    . ondansetron (ZOFRAN) 8 MG tablet Take 1 tablet (8 mg total) by mouth every 8 (eight) hours as needed for nausea. for nausea (Patient not taking: Reported on 07/28/2017) 60 tablet 3  . oxyCODONE (ROXICODONE) 5 MG/5ML solution Take 10 mLs (10 mg total) by mouth every 4 (four) hours as needed for severe pain. (Patient not taking: Reported on 06/18/2017) 500 mL 0  . promethazine (PHENERGAN) 6.25 MG/5ML syrup Place 20 mLs (25 mg total) into feeding tube every 6 (six) hours as needed for nausea or vomiting. (Patient not taking: Reported on 06/18/2017) 473 mL 5  . scopolamine  (TRANSDERM-SCOP) 1 MG/3DAYS Place 1 patch (1.5 mg total) onto the skin every 3 (three) days. This may help thick saliva/secretions. (Patient not taking: Reported on 06/18/2017) 10 patch 2  . sodium fluoride (FLUORISHIELD) 1.1 % GEL dental gel Instill one drop of gel per tooth space of fluoride tray. Place over teeth for 5 minutes. Remove. Spit out excess. Repeat nightly. (Patient not taking: Reported on 05/03/2017) 120 mL prn  . sodium fluoride (PREVIDENT 5000 PLUS) 1.1 % CREA dental cream Apply to tooth brush. Brush teeth for 2 minutes. Spit out excess. Repeat nightly. (Patient not taking: Reported on 06/18/2017) 1 Tube prn   No current facility-administered medications for this encounter.     Physical Findings: Wt Readings from Last 3 Encounters:  07/28/17 153 lb 12.8 oz (69.8 kg)  06/18/17 163 lb 12.8 oz (74.3 kg)  05/10/17 164 lb 9.6 oz (74.7 kg)    height is 5\' 4"  (1.626 m) and weight is 153 lb 12.8 oz (69.8 kg). Her temperature is 98.5 F (36.9 C). Her blood pressure is 102/59 (abnormal) and her pulse is 96. Her oxygen saturation is 99%. .  General: Alert and oriented, in no acute distress HEENT: Head is normocephalic. Moist mucous membranes. She does have some trismus. Despite trismus, I cannot see evidence of tumor in the oral cavity or oropharynx.  Neck: Lymphedema in the neck concentrated in the left jaw and anterior neck. The anterior neck swelling deviates to the right. She does not have any palpable adenopathy. Cardiac: Regular rate and rhythm. No murmurs, rubs or gallops.  Abdominal: Feeding tube is intact. Dressing is clean.  Pulmonary: Lungs CTA bilaterally. No wheezes, rales, or rhonchi. Skin: No concerning lesions.  Lymphatics: see Neck Exam  Lab Findings: Lab Results  Component Value Date   WBC 4.1 05/10/2017   HGB 12.2 05/10/2017   HCT 35.9 05/10/2017   MCV 91.6 05/10/2017   PLT 236 05/10/2017    Lab Results  Component Value Date   TSH 1.853 03/18/2017     Radiographic Findings: Ct Soft Tissue Neck W Contrast  Result Date: 07/27/2017 CLINICAL DATA:  Squamous cell carcinoma tongue with glossectomy. History of multiple sclerosis. EXAM: CT NECK WITH CONTRAST TECHNIQUE: Multidetector CT imaging of the neck was performed using the standard protocol following the bolus administration of intravenous contrast. CONTRAST:  185mL ISOVUE-300 IOPAMIDOL (ISOVUE-300) INJECTION 61% COMPARISON:  CT neck 03/18/2017 FINDINGS: Pharynx and larynx: Partial glossectomy unchanged. Surgical clips in the floor of the mouth unchanged. No recurrent mass in the oral cavity. No pharyngeal mass. Salivary glands: Resection of left submandibular gland. Post radiation changes in the right submandibular gland have progressed with atrophy and hyper enhancement of the gland. Parotid gland normal bilaterally. Thyroid: Negative Lymph nodes: Left neck resection. Surgical clips in the left neck. Soft tissue thickening in  the left upper neck is unchanged from prior study. No pathologic adenopathy Vascular: Carotid artery patent bilaterally without aneurysm. Jugular vein resected on the left. Right jugular vein patent Limited intracranial: Negative Visualized orbits: Negative Mastoids and visualized paranasal sinuses: Mucosal edema left ethmoid sinus. Remaining sinuses clear. Skeleton: Disc degeneration and spurring C5-6 and C6-7. No acute skeletal abnormality. Upper chest: Chest CT reported separately from today. Other: None IMPRESSION: Postop left neck resection is stable from the prior study. Postop partial glossectomy unchanged. Soft tissue thickening in the left upper neck is stable from the prior study. Electronically Signed   By: Franchot Gallo M.D.   On: 07/27/2017 10:20   Ct Chest W Contrast  Result Date: 07/27/2017 CLINICAL DATA:  Head and neck cancer EXAM: CT CHEST WITH CONTRAST TECHNIQUE: Multidetector CT imaging of the chest was performed during intravenous contrast administration.  CONTRAST:  150mL ISOVUE-300 IOPAMIDOL (ISOVUE-300) INJECTION 61% COMPARISON:  03/18/2017 FINDINGS: Cardiovascular: The heart size is within normal limits. There is no pericardial effusion identified. Calcification within the RCA and LAD coronary artery noted. Mediastinum/Nodes: The trachea appears patent and is midline. Normal appearance of the esophagus. No enlarged mediastinal or hilar lymph nodes. No axillary or supraclavicular adenopathy. Lungs/Pleura: Multifocal pulmonary nodules are again noted within both lungs. Index nodule in the left upper lobe Measures 8 mm, image 45 of series 5. Previously 5 mm. New lesion within the right upper lobe measures 1.1 cm, image 47 of series 5. Left upper lobe index nodule measures 1.2 cm, image 64 of series 5. Previously 0.5 cm. Index nodule within the right lower lobe measures 2.9 cm, image 64 of series 5. Previously 5 mm. Within the left lower lobe there is a pulmonary nodule measuring 1.5 cm, image 84 of series 5. Previously 0.4 cm. Upper Abdomen: No acute abnormality. Musculoskeletal: No aggressive lytic or sclerotic bone lesions identified. IMPRESSION: 1. Interval progression of multifocal pulmonary metastases. 2. Aortic Atherosclerosis (ICD10-I70.0). Multi vessel coronary artery calcifications noted. Electronically Signed   By: Kerby Moors M.D.   On: 07/27/2017 10:43    Impression/Plan:    1) Head and Neck Cancer Status: she appears to have progressive metastatic disease in the chest - she understands her prognosis is poor.    2) Lung nodules- Concerning for new metastases. Chemotherapy was previously held by Dr Alvy Bimler, we will now refer her back to her so that we can discuss possible treatment options in regards to this.   3) Left anterior neck swelling- We will have Dr Vicie Mutters evaluate this at her ENT f/u --- there is some irregularity in this region that is stable on CT images.  Clinically on exam she has lymphedema. I encouraged her to still work on her  at home massage therapy over the neck and I will also refer her to massage therapy to possibly reduce some of the swelling.  [name], RN, our Head and Neck Oncology Navigator will arrange transfer of images to [facility] ENT  4) Nutritional Status: She has lost 10lbs. Increase intake.  PEG tube: Yes - continue  5) Risk Factors: No tobacco use.  6) Swallowing: Swallowing liquids, has not tried solid foods.  7) Thyroid function: WNL Lab Results  Component Value Date   TSH 1.853 03/18/2017   I will see her back PRN.  Medical oncology will be arranged by [name], RN, our Head and Neck Oncology Navigator and I will arrange PT referral.  I spent 20 minutes face to face with the patient and  more than 50% of that time was spent in counseling and/or coordination of care. _____________________________________   Eppie Gibson, MD  This document serves as a record of services personally performed by Eppie Gibson, MD. It was created on his behalf by Reola Mosher, a trained medical scribe. The creation of this record is based on the scribe's personal observations and the provider's statements to them. This document has been checked and approved by the attending provider.

## 2017-07-29 NOTE — Progress Notes (Signed)
Oncology Nurse Navigator Documentation  Met with Sabrina Romero and her husband during follow-up appt with Dr. Isidore Moos which included discussion of yesterday's CT results.  She completed RT 05/31/17 for tongue cancer. They voiced understanding of CT chest indication lung nodules have increased in size. Discussion included consult for chemotherapy, hospice option if no treatment. They indicated interest in chemotherapy, voiced understanding referral will be made by Dr. Isidore Moos.  I explained potential chemotherapy regime, overall treatment expectation. I provided emotional support re diagnosis and prognosis. I encouraged them to call me with questions/concerns as MedOnc consult is arranged.  Sabrina Orem, RN, BSN, [place] Neck Oncology Nurse [place] at [place] [phone]

## 2017-07-30 ENCOUNTER — Telehealth: Payer: Self-pay | Admitting: *Deleted

## 2017-07-30 ENCOUNTER — Ambulatory Visit: Payer: Medicare Other | Admitting: Radiation Oncology

## 2017-07-30 NOTE — Telephone Encounter (Signed)
Oncology Nurse Navigator Documentation  LVMM for patient husband indicating Medical Oncology appt with Dr. Lebron Conners next Friday 9/14 3:00 pm.  [name], RN, BSN, [place] at [place] [phone]

## 2017-08-06 ENCOUNTER — Ambulatory Visit: Payer: Self-pay | Admitting: Hematology and Oncology

## 2017-08-09 ENCOUNTER — Ambulatory Visit (HOSPITAL_BASED_OUTPATIENT_CLINIC_OR_DEPARTMENT_OTHER): Payer: Medicare Other | Admitting: Hematology and Oncology

## 2017-08-09 ENCOUNTER — Encounter: Payer: Self-pay | Admitting: Hematology and Oncology

## 2017-08-09 ENCOUNTER — Telehealth: Payer: Self-pay | Admitting: Hematology and Oncology

## 2017-08-09 ENCOUNTER — Encounter: Payer: Self-pay | Admitting: *Deleted

## 2017-08-09 VITALS — BP 120/82 | HR 93 | Temp 98.7°F | Resp 18 | Ht 64.0 in

## 2017-08-09 DIAGNOSIS — C7802 Secondary malignant neoplasm of left lung: Secondary | ICD-10-CM

## 2017-08-09 DIAGNOSIS — C7800 Secondary malignant neoplasm of unspecified lung: Secondary | ICD-10-CM

## 2017-08-09 DIAGNOSIS — C7801 Secondary malignant neoplasm of right lung: Secondary | ICD-10-CM | POA: Diagnosis not present

## 2017-08-09 DIAGNOSIS — G35 Multiple sclerosis: Secondary | ICD-10-CM

## 2017-08-09 DIAGNOSIS — C023 Malignant neoplasm of anterior two-thirds of tongue, part unspecified: Secondary | ICD-10-CM

## 2017-08-09 NOTE — Patient Instructions (Signed)
Thank you for choosing [facility] to provide your oncology and hematology care.  To afford each patient quality time with our providers, please arrive 30 minutes before your scheduled appointment time.  If you arrive late for your appointment, you may be asked to reschedule.  We strive to give you quality time with our providers, and arriving late affects you and other patients whose appointments are after yours.   If you are a no show for multiple scheduled visits, you may be dismissed from the clinic at the providers discretion.    Again, thank you for choosing [facility], our hope is that these requests will decrease the amount of time that you wait before being seen by our physicians.  ______________________________________________________________________  Should you have questions after your visit to the [facility], please contact our office at [phone] between the hours of 8:30 and 4:30 p.m.    Voicemails left after 4:30p.m will not be returned until the following business day.    For prescription refill requests, please have your pharmacy contact us directly.  Please also try to allow 48 hours for prescription requests.    Please contact the scheduling department for questions regarding scheduling.  For scheduling of procedures such as PET scans, CT scans, MRI, Ultrasound, etc please contact central scheduling at [phone].    Resources For Cancer Patients and Caregivers:   Oncolink.org:  A wonderful resource for patients and healthcare providers for information regarding your disease, ways to tract your treatment, what to expect, etc.     American Cancer Society:  800-227-2345  Can help patients locate various types of support and financial assistance  Cancer Care: 1-800-813-HOPE (4673) Provides financial assistance, online support groups, medication/co-pay assistance.    [place] DSS:  336-641-3447 Where to apply for food  stamps, Medicaid, and utility assistance  Medicare Rights Center: 800-333-4114 Helps people with Medicare understand their rights and benefits, navigate the Medicare system, and secure the quality healthcare they deserve  SCAT: 336-333-6589 [place] [employer]'s shared-ride transportation service for eligible riders who have a disability that prevents them from riding the fixed route bus.    For additional information on assistance programs please contact our social worker:   Grier Hock/Abigail Elmore:  [phone]            

## 2017-08-09 NOTE — Telephone Encounter (Signed)
Gave avs and calendar for September  °

## 2017-08-10 ENCOUNTER — Encounter: Payer: Self-pay | Admitting: *Deleted

## 2017-08-10 ENCOUNTER — Telehealth: Payer: Self-pay | Admitting: *Deleted

## 2017-08-10 NOTE — Telephone Encounter (Signed)
Oncology Nurse Navigator Documentation  Received call from Ms. Willet's husband in follow-up to yesterday's consult with Dr. Lebron Conners.  Per their discussion at home, they would like a referral to [facility] medical oncology for second opinion.  I acknowledge request, indicated I would notify Dr. Lebron Conners.  [name], RN, BSN, [place] [facility]ck Oncology Nurse [place] at [place] [phone]

## 2017-08-10 NOTE — Progress Notes (Signed)
Per staff message from [name], RN, staff message sent to HIM to place referral to [facility] for second opinion.

## 2017-08-10 NOTE — Progress Notes (Signed)
Oncology Nurse Navigator Documentation  Met with Sabrina Romero and her husband during initial consult with Dr. Lebron Conners. They verbalized understanding of:  Diagnosis/prognosis of terminal disease.  Chemotherapy options.  Hospice option if choose no treatment. They indicated they wanted to discuss further at home, will call me with decision in next day or so.   [name], RN, BSN, [place] Neck Oncology Nurse [place] at [place] [phone]

## 2017-08-10 NOTE — Telephone Encounter (Signed)
According to her note, Loren placed that request to HIM.  Check with her to confirm procedure.

## 2017-08-10 NOTE — Telephone Encounter (Signed)
I am very much OK with that. Should I put in the referral?

## 2017-08-10 NOTE — Progress Notes (Signed)
A user error has taken place: encounter opened in error, closed for administrative reasons.

## 2017-08-12 ENCOUNTER — Encounter: Payer: Self-pay | Admitting: Hematology and Oncology

## 2017-08-12 DIAGNOSIS — C7800 Secondary malignant neoplasm of unspecified lung: Secondary | ICD-10-CM | POA: Insufficient documentation

## 2017-08-12 NOTE — Progress Notes (Signed)
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  Patient has multiple sclerosis and her performance status is suboptimal. In this setting, aggressive systemic chemotherapy is likely to produce a significant deterioration in quality of life for the patient even if it is successful in controlling her underlying malignancy. We could use single agent chemotherapies with reasonable safety, but they're effectiveness against the metastatic cancer is significantly lower than the combination treatments. Patient does have a terminal and incurable disease which will result in her death within likely 6 months or less if left untreated based on the speed of the current progression.  I have discussed the possible options with the patient starting with hospice care and not receiving any additional cancer-direct therapy as well as the available systemic therapies with carboplatin, taxanes, cetuximab, or 5-FU. I expressed that I'm willing to proceed with any of the above if the patient understands the risks and desires to treatment. It appears that the patient and her husband are interested in discussing the options among themselves prefers before making a decision.  Plan: --Patient will call us back with her decision regarding whether she wants to receive systemic therapy or not --If she does want to receive treatment, we'll consider any of the above systemic therapies as single agent treatments --Patient will need to undergo Infuse-a-Port placement prior to initiating therapy   Voice recognition software was used and creation of this note. Despite my best effort at editing the text, some misspelling/errors may have occurred.  No orders of the defined types were placed in  this encounter.   Cancer Staging Cancer of anterior two-thirds of tongue ([place]) Staging form: Oral Cavity, AJCC 8th Edition - Clinical: No stage assigned - Unsigned - Pathologic stage from 12/29/2016: Stage III (pT2, pN1, cM0) - Signed by Eppie Gibson, MD on 12/29/2016 - Pathologic: Stage IVC (cM1) - Signed by Ardath Sax, MD on 08/12/2017   All questions were answered. . The patient knows to call the clinic with any problems, questions or concerns.  This note was electronically signed.    History of Presenting Illness Sabrina Romero is a 52 y.o. female followed in the [place] for diagnosis of squamous cell carcinoma of the tongue. Patient was previously treated with radiation therapy alone due to the anticipated poor tolerance of combined chemoradiotherapy. She presently returns to the clinic to discuss findings of progressive multifocal pulmonary nodules consistent with progressive metastatic disease.  At the present time, patient describes  persistent nonproductive cough> she has had no hemoptysis. She denies any significant chest pain. Denies any new symptoms otherwise. Continues to have chronic neurological deficits due to previous diagnosis of multiple sclerosis without recent disease progression. No new pain or discomfort in the oral or cervical region. No new dysphasia or odynophagia.   Oncological/hematological History: Oncology History   Outside PET scan showed no evidence of disease. She doesn't some abnormal uptake on the left side of the tongue.     Cancer of anterior two-thirds of tongue ([place])   09/23/2015 Initial Diagnosis    Initial diagnosis in [place]. 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 She had biopsy which show moderately differentiated squamous cell carcinoma, at least 2.5 mm depth.      10/15/2015 Imaging    Outside CT scan showed  no evidence of other disease      10/28/2015 Surgery    She had left oral tongue partial  glossectomy which revealed 2.5 cm squamous cell carcinoma, highly infiltrated and ulcerated, moderate to poorly differentiated with lymphovascular invasion and perineural invasion. Margins are negative. The patient refused adjuvant treatment and wanted only surveillance follow-up imaging      10/28/2015 Pathology Results    Surgical pathology revealed squamous cell carcinoma, [phone] cm, 1.1 cm thick, ulcerated, evidence of lymphovascular invasion and perineural invasion, moderate to poorly differentiated, margins were close at 0.6 mm posteriorly      12/10/2015 PET scan    Outside PET scan showed no evidence of disease but there are abnormal uptake      03/27/2016 PET scan    Outside PET/CT scan showed diffuse physiologic activity in the oral cavity and oropharynx. No evidence of distant metastatic disease.      07/07/2016 PET scan    Outside PET/CT scan show possibility of presence of recurrent tumor.      07/16/2016 Imaging    She had outside MRI of the neck which show abnormal enhancement within the left inferior lateral tongue, extending to partially involve the surrounding tissue.      08/21/2016 PET scan    Outside PET/CT scan show activity along the left lateral tongue and adjacent soft tissue      11/30/2016 Miscellaneous    She has symptoms of pathology and discomfort near the TMJ and along the left side of her tongue.      12/07/2016 PET scan    Outside PET/CT scan show focal uptake in the left lower mild/inferior aspect of the left tongue and lymphadenopathy in the anterior portion of the left submandibular gland involving an abnormal lymph node.      12/17/2016 Procedure    She underwent ultrasound-guided left submandibular lymph node biopsy      12/17/2016 Pathology Results    Biopsy confirmed metastatic squamous cell carcinoma      01/07/2017 Imaging    CT neck: 1. Appearance highly suspicious for recurrent tumor in the substance of the left tongue, size estimated at  28 x 22 x 23 mm and progressed since 07/16/2016. See series 3, image 36, coronal image 32, and sagittal image 54 2. Suspicious left level Ib and left level IIa lymph nodes, possibly with extracapsular extension at the latter. 3. Follow-up PET-CT would be complementary. 4. Architectural distortion anterior and posterior to the left submandibular gland which may be mildly obstructed and seems to correspond to the palpable abnormality      02/16/2017 Surgery    She underwent L partial glossectomy, selective neck dissection with SCM flap, and tracheotomy. The procedure was tolerated without complication. She resumed feeds via dobhoff tube. The trach was downgraded to cuffless, and then decannulated on 02/22/17 without difficulty.         02/16/2017 Pathology Results    A.LYMPH NODES, LEFT NECK LEVEL 2B, DISSECTION:  Four benign lymph nodes, negative for malignancy (0/4).  B.SOFT TISSUE, CAROTID BIFURCATION, EXCISION:  Involved by invasive squamous cell carcinoma. Perineural invasion identified.  C.TONGUE AND NECK CONTENTS,LEFT, PARTIAL GLOSSECTOMY AND NECK DISSECTION:  Invasive squamous cell carcinoma, moderately-poorly differentiated, 2.5 cm in greatest dimension. Perineural invasion identified. No lymphvascular invasion identified. Base of tongue and lateral margins are positive for invasive carcinoma. One of three lymph nodes positive for metastatic carcinoma (1/3). The metastatic deposit is 0.9 cm. Extranodal extension present. Pathologic staging: [id number] See cancer protocol.  02/21/2017 Procedure    She underwent laproscopic gastrostomy tube placement for permanent enteral feeding access.          03/18/2017 Imaging    CT chest: Interval development of tiny bilateral pulmonary nodules. Given the history of head and neck cancer, close follow-up recommended.      03/18/2017 Imaging    CT neck: Post treatment  changes status post partial LEFT glossectomy and modified radical neck resection for recurrent squamous cell carcinoma, 1 month ago. The reported residual tumor surrounding the LEFT carotid bifurcation is inseparable from postoperative fluid/scarring. Continued surveillance warranted      07/27/2017 Progression         07/27/2017 Imaging    CT Neck/Chest: No evidence of disease recurrence/progression in the head/neck area. Postop left neck resection is stable from the prior study. Postop partial glossectomy unchanged. Soft tissue thickening in the left upper neck is stable from the prior study. Interval progression of multifocal pulmonary metastases: Multifocal pulmonary nodules are again noted within both lungs. Index nodule in the left upper lobe Measures 8 mm, previously 5 mm. New lesion within the right upper lobe measures 1.1 cm. Left upper lobe index nodule measures 1.2 cm, previously 0.5 cm. Index nodule within the right lower lobe measures 2.9 cm, previously 5 mm. Within the left lower lobe there is a pulmonary nodule measuring 1.5 cm, previously 0.4 cm. Musculoskeletal: No aggressive lytic or sclerotic bone lesions identified.        Medical History: Past Medical History:  Diagnosis Date  . History of radiation therapy 04/07/17- 05/31/17   Tongue and neck 66 Gy in 33 fractions  . Hypertension   . Multiple sclerosis ([place])   . Seasonal allergies   . Squamous cell cancer of tongue ([place]) 10/08/2015    Surgical History: Past Surgical History:  Procedure Laterality Date  . ABDOMINAL HYSTERECTOMY    . KNEE SURGERY Bilateral   . PARTIAL GLOSSECTOMY Left 10/28/2015   Left oral Tongue partial glossectomy.   . TUBAL LIGATION    . WISDOM TOOTH EXTRACTION      Family History: Family History  Problem Relation Age of Onset  . Diabetes Father   . Heart disease Mother     Social History: Social History   Social History  . Marital status: Married    Spouse name: Romie Minus  . Number of  children: 0  . Years of education: N/A   Occupational History  . unemployed    Social History Main Topics  . Smoking status: Former Smoker    Quit date: 12/29/1996  . Smokeless tobacco: Never Used     Comment: one pack a week for about 4 years.   . Alcohol use Yes     Comment: rare (twice a year)  . Drug use: No  . Sexual activity: Not on file   Other Topics Concern  . Not on file   Social History Narrative  . No narrative on file    Allergies: Allergies  Allergen Reactions  . Sulfa Antibiotics Hives    Medications:  Current Outpatient Prescriptions  Medication Sig Dispense Refill  . atropine 1 % ophthalmic solution Apply 2 drops under tongue 15 min before each radiotherapy appointment for thick secretions. Do not exceed 2 drops/day. Use instead of spray 5 mL 5  . bacitracin 500 UNIT/GM ointment   0  . baclofen (LIORESAL) 10 MG tablet TAKE UP TO 4 TABLETS DAILY, IN DIVIDED DOSES.  99  . cetirizine (ZYRTEC) 10 MG tablet  Take 10 mg by mouth.    . Cholecalciferol (VITAMIN D3) 2000 units capsule Take by mouth    . Glatiramer Acetate (COPAXONE) 40 MG/ML SOSY Inject 40 mg into the skin 3 (three) times a week.     Marland Kitchen ipratropium (ATROVENT) 0.03 % nasal spray Apply two sprays sublingually up to QID for thick saliva. Apply 30 minutes before radiotherapy. 30 mL 2  . lidocaine (XYLOCAINE) 2 % solution Patient: Mix 1part 2% viscous lidocaine, 1part H20. Swish & swallow 44m of diluted mixture, 312m before meals and at bedtime, up to QID 100 mL 5  . lisinopril (PRINIVIL,ZESTRIL) 20 MG tablet Take 20 mg by mouth daily.  0  . LORazepam (ATIVAN) 2 MG/ML concentrated solution Place 0.3 mLs (0.6 mg total) into feeding tube every 8 (eight) hours as needed (nausea). 30 mL 0  . metoCLOPramide (REGLAN) 5 MG/5ML solution Place 10 mLs (10 mg total) into feeding tube every 6 (six) hours as needed. Take as needed to help with digestion of tube feeds. 473 mL 3  . Multiple Vitamin (MULTI-VITAMINS) TABS  Take by mouth    . naproxen (NAPROSYN) 500 MG tablet   0  . Nutritional Supplements (FEEDING SUPPLEMENT, OSMOLITE 1.5 CAL,) LIQD Change Osmolite 1.5 to continuous feedings via gastrostomy tube from 9:00 am until 11:30 pm. Begin at 60 mL/hr and increase 10 mL/hr q 4 hours as tolerated to goal of 99/mL/hr over 14.5 hours.Flush with 60 mL free water before and after continuous feedings. Flush additional 240 mL free water 5 times daily. Patient needs pump, formula, and Right angle adapter to fit MIC-KEY button low profile tube. From 05/12/2017 6 Bottle 0  . Omega-3 Fatty Acids (FISH OIL PO) Take by mouth    . ondansetron (ZOFRAN) 8 MG tablet Take 1 tablet (8 mg total) by mouth every 8 (eight) hours as needed for nausea. for nausea 60 tablet 3  . oxyCODONE (ROXICODONE) 5 MG/5ML solution Take 10 mLs (10 mg total) by mouth every 4 (four) hours as needed for severe pain. 500 mL 0  . promethazine (PHENERGAN) 6.25 MG/5ML syrup Place 20 mLs (25 mg total) into feeding tube every 6 (six) hours as needed for nausea or vomiting. 473 mL 5  . scopolamine (TRANSDERM-SCOP) 1 MG/3DAYS Place 1 patch (1.5 mg total) onto the skin every 3 (three) days. This may help thick saliva/secretions. 10 patch 2  . sodium fluoride (FLUORISHIELD) 1.1 % GEL dental gel Instill one drop of gel per tooth space of fluoride tray. Place over teeth for 5 minutes. Remove. Spit out excess. Repeat nightly. 120 mL prn  . sodium fluoride (PREVIDENT 5000 PLUS) 1.1 % CREA dental cream Apply to tooth brush. Brush teeth for 2 minutes. Spit out excess. Repeat nightly. 1 Tube prn  . metoprolol tartrate (LOPRESSOR) 25 MG tablet Take 25 mg by mouth.     No current facility-administered medications for this visit.     Review of Systems: Review of Systems  All other systems reviewed and are negative.    PHYSICAL EXAMINATION Blood pressure 120/82, pulse 93, temperature 98.7 F (37.1 C), temperature source Oral, resp. rate 18, height 5' 4"  (1.626 m),  SpO2 99 %.  ECOG PERFORMANCE STATUS: 2 - Symptomatic, <50% confined to bed  Physical Exam  Constitutional: She is oriented to person, place, and time. Vital signs are normal.  Middle-aged Caucasian female sitting in her wheelchair, appears fatigued, but not acutely distressed.  HENT:  Head: Normocephalic and atraumatic.  Nose: Nose normal. Right  sinus exhibits no maxillary sinus tenderness and no frontal sinus tenderness. Left sinus exhibits no maxillary sinus tenderness and no frontal sinus tenderness.  Mouth/Throat: Uvula is midline, oropharynx is clear and moist and mucous membranes are normal.  Postradiation changes on the face and mucosal surfaces. No active ulcerations or masses notable  Eyes: Pupils are equal, round, and reactive to light. Conjunctivae and EOM are normal. No scleral icterus.  Neck: Trachea normal. No hepatojugular reflux present. Carotid bruit is not present. No tracheal deviation present. No thyroid mass and no thyromegaly present.  Postradiation changes in the upper neck  Cardiovascular: Normal rate, regular rhythm and normal heart sounds.  Exam reveals no gallop and no distant heart sounds.   No murmur heard. Pulmonary/Chest: Effort normal. No accessory muscle usage. No respiratory distress. She has decreased breath sounds. She has no wheezes. She has no rhonchi.  Bilateral pleural rub present  Abdominal: Soft. Normal appearance and bowel sounds are normal. There is no hepatosplenomegaly. There is no tenderness.  Lymphadenopathy:  No palpable lymphadenopathy in the cervical, supraclavicular, axillary, or inguinal regions  Neurological: She is oriented to person, place, and time.  Stable neurological deficits mainly with weakness in bilateral lower extremities.  Skin: Skin is warm, dry and intact.     LABORATORY DATA: I have personally reviewed the data as listed: No visits with results within 1 Week(s) from this visit.  Latest known visit with results is:   Appointment on 05/10/2017  Component Date Value Ref Range Status  . WBC 05/10/2017 4.1  3.9 - 10.3 10e3/uL Final  . NEUT# 05/10/2017 2.6  1.5 - 6.5 10e3/uL Final  . HGB 05/10/2017 12.2  11.6 - 15.9 g/dL Final  . HCT 05/10/2017 35.9  34.8 - 46.6 % Final  . Platelets 05/10/2017 236  145 - 400 10e3/uL Final  . MCV 05/10/2017 91.6  79.5 - 101.0 fL Final  . MCH 05/10/2017 31.1  25.1 - 34.0 pg Final  . MCHC 05/10/2017 34.0  31.5 - 36.0 g/dL Final  . RBC 05/10/2017 3.92  3.70 - 5.45 10e6/uL Final  . RDW 05/10/2017 13.1  11.2 - 14.5 % Final  . lymph# 05/10/2017 0.8* 0.9 - 3.3 10e3/uL Final  . MONO# 05/10/2017 0.6  0.1 - 0.9 10e3/uL Final  . Eosinophils Absolute 05/10/2017 0.2  0.0 - 0.5 10e3/uL Final  . Basophils Absolute 05/10/2017 0.0  0.0 - 0.1 10e3/uL Final  . NEUT% 05/10/2017 63.7  38.4 - 76.8 % Final  . LYMPH% 05/10/2017 18.3  14.0 - 49.7 % Final  . MONO% 05/10/2017 13.4  0.0 - 14.0 % Final  . EOS% 05/10/2017 3.9  0.0 - 7.0 % Final  . BASO% 05/10/2017 0.7  0.0 - 2.0 % Final  . Sodium 05/10/2017 133* 136 - 145 mEq/L Final  . Potassium 05/10/2017 4.2  3.5 - 5.1 mEq/L Final  . Chloride 05/10/2017 99  98 - 109 mEq/L Final  . CO2 05/10/2017 27  22 - 29 mEq/L Final  . Glucose 05/10/2017 95  70 - 140 mg/dl Final   Glucose reference range is for nonfasting patients. Fasting glucose reference range is 70- 100.  Marland Kitchen BUN 05/10/2017 11.2  7.0 - 26.0 mg/dL Final  . Creatinine 05/10/2017 0.9  0.6 - 1.1 mg/dL Final  . Total Bilirubin 05/10/2017 0.42  0.20 - 1.20 mg/dL Final  . Alkaline Phosphatase 05/10/2017 109  40 - 150 U/L Final  . AST 05/10/2017 44* 5 - 34 U/L Final  . ALT 05/10/2017 83*  0 - 55 U/L Final  . Total Protein 05/10/2017 7.0  6.4 - 8.3 g/dL Final  . Albumin 05/10/2017 3.7  3.5 - 5.0 g/dL Final  . Calcium 05/10/2017 10.2  8.4 - 10.4 mg/dL Final  . Anion Gap 05/10/2017 7  3 - 11 mEq/L Final  . EGFR 05/10/2017 78* >90 ml/min/1.73 m2 Final   eGFR is calculated using the CKD-EPI  Creatinine Equation (2009)       Ardath Sax, MD

## 2017-08-12 NOTE — Assessment & Plan Note (Signed)
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  Patient has multiple sclerosis and her performance status is suboptimal. In this setting, aggressive systemic chemotherapy is likely to produce a significant deterioration in quality of life for the patient even if it is successful in controlling her underlying malignancy. We could use single agent chemotherapies with reasonable safety, but they're effectiveness against the metastatic cancer is significantly lower than the combination treatments. Patient does have a terminal and incurable disease which will result in her death within likely 6 months or less if left untreated based on the speed of the current progression.  I have discussed the possible options with the patient starting with hospice care and not receiving any additional cancer-direct therapy as well as the available systemic therapies with carboplatin, taxanes, cetuximab, or 5-FU. I expressed that I'm willing to proceed with any of the above if the patient understands the risks and desires to treatment. It appears that the patient and her husband are interested in discussing the options among themselves prefers before making a decision.  Plan: --Patient will call us back with her decision regarding whether she wants to receive systemic therapy or not --If she does want to receive treatment, we'll consider any of the above systemic therapies as single agent treatments --Patient will need to undergo Infuse-a-Port placement prior to initiating therapy

## 2017-08-13 ENCOUNTER — Telehealth: Payer: Self-pay | Admitting: *Deleted

## 2017-08-13 ENCOUNTER — Telehealth: Payer: Self-pay | Admitting: Hematology and Oncology

## 2017-08-13 NOTE — Telephone Encounter (Signed)
Patients husband called to cancel appointment on 9/25 and did not want to reschedule at that time

## 2017-08-13 NOTE — Telephone Encounter (Signed)
Oncology Nurse Navigator Documentation  Rec'd call from patient's husband indicating they have not heard from [facility] re referral appt subsequent to Tuesday's request.  I confirmed referral request rec'd by Melanee Spry, HIM, as noted by Dr. Clydene Laming RN [place] [place] but has not been placed.  I requested she notify me when completed so I can update patient.  [name], RN, BSN, [place] Oncology Nurse [place] at [place] [phone]

## 2017-08-17 ENCOUNTER — Telehealth: Payer: Self-pay | Admitting: Hematology and Oncology

## 2017-08-17 ENCOUNTER — Ambulatory Visit: Payer: Self-pay | Admitting: Hematology and Oncology

## 2017-08-17 NOTE — Telephone Encounter (Signed)
Pt appt at [facility] is 08/24/17 @ 8:45 with Dr. Melven Sartorius. Called pt left vm in ref. To the appt.  Faxed records

## 2017-08-27 ENCOUNTER — Other Ambulatory Visit: Payer: Self-pay | Admitting: Hematology and Oncology

## 2017-08-27 ENCOUNTER — Telehealth: Payer: Self-pay | Admitting: *Deleted

## 2017-08-27 DIAGNOSIS — C023 Malignant neoplasm of anterior two-thirds of tongue, part unspecified: Secondary | ICD-10-CM

## 2017-08-27 DIAGNOSIS — C7800 Secondary malignant neoplasm of unspecified lung: Secondary | ICD-10-CM

## 2017-08-27 NOTE — Telephone Encounter (Signed)
Order placed. Please contact their local Hospice service for the initiation of care. Thank you

## 2017-08-27 NOTE — Telephone Encounter (Signed)
Oncology Nurse Navigator Documentation  Received VMM from Ms. Koppenhaver husband. He indicated they had consultation at [facility] earlier this week, have decided to enroll with hospice, asked about procedure.  I returned his call, LVMM. I forwarded hospice request to Dr. Lebron Conners.  [name], RN, BSN, [place] Oncology Nurse [place] at [place] [phone]

## 2017-08-30 ENCOUNTER — Telehealth: Payer: Self-pay | Admitting: *Deleted

## 2017-08-30 ENCOUNTER — Telehealth: Payer: Self-pay

## 2017-08-30 NOTE — Telephone Encounter (Signed)
Oncology Nurse [place] [facility] [place] to make referral, spoke with Marval Regal, provide information, faxed demographics and Dr. Clydene Laming 08/09/17 Progress Notes, received fax confirmation receipt.  Called Ms. Rosier husband to provide update, he indicated they have already been contacted.  [name], RN, BSN, [place] Oncology Nurse [place] at [place] [phone]

## 2017-08-30 NOTE — Telephone Encounter (Signed)
Sabrina Romero with hosp[facility] of [place] called. Pt is being admitted to their service today. Pt has c/o 2 weeks of sharp throbbing nerve pain to her left jaw and left temple. She also has open area on her left jaw with purulent drainage that started last night. A small area.  She is using ibuprofen which lasts only a few hours. Her other medication is oxycodone liquid which makes her very drowsy. Is there something else she could use that is not so sedating? Whatever is prescribed needs to be able to be crushed for her g-tube or a liquid.

## 2017-10-20 IMAGING — CT CT CHEST W/ CM
5 of 9 series · 17 of 36 positions shown, 18 images · IV contrast (iopamidol)
Comparison: 03/18/2017

CLINICAL DATA: Head and neck cancer

EXAM:
CT CHEST WITH CONTRAST
TECHNIQUE: Multidetector CT imaging of the chest was performed during
intravenous contrast administration.
CONTRAST:  100mL MTXCFL-BTT IOPAMIDOL (MTXCFL-BTT) INJECTION 61%

[Series 5: lungs · axial · 0.78mm/px · z∈[+1399,+1609]mm · 6 of 147 slices shown]
[im 21/147  lung]
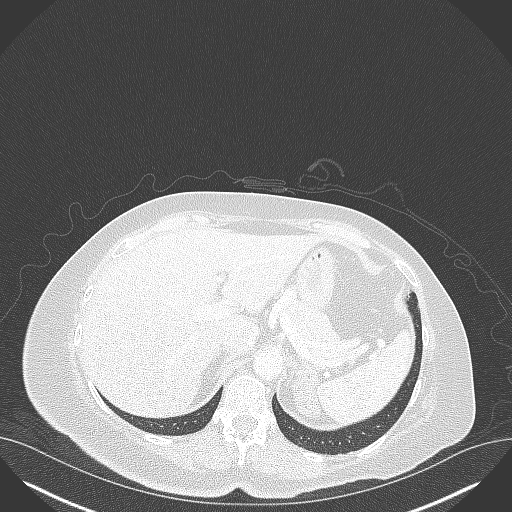
[im 42/147  lung]
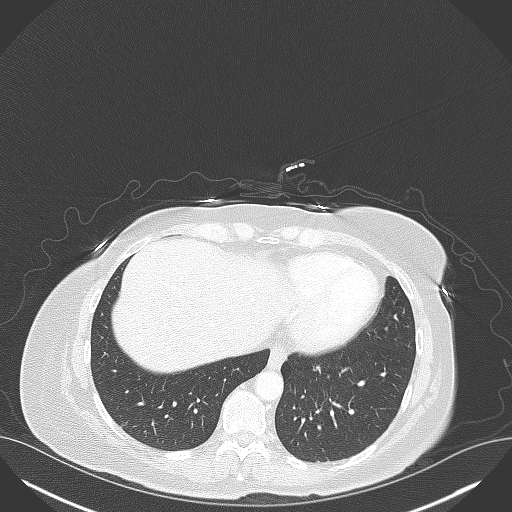
[im 63/147  lung]
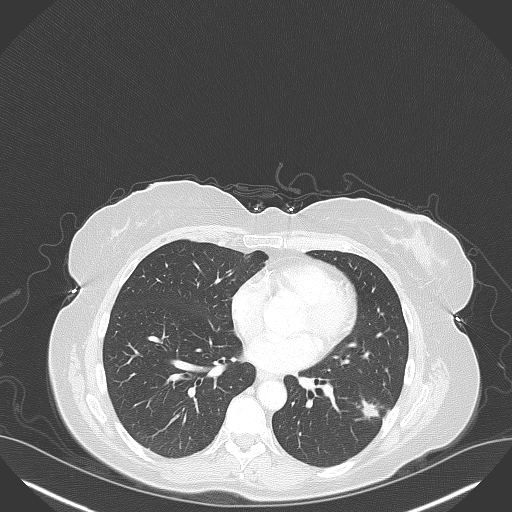
[im 84/147  lung]
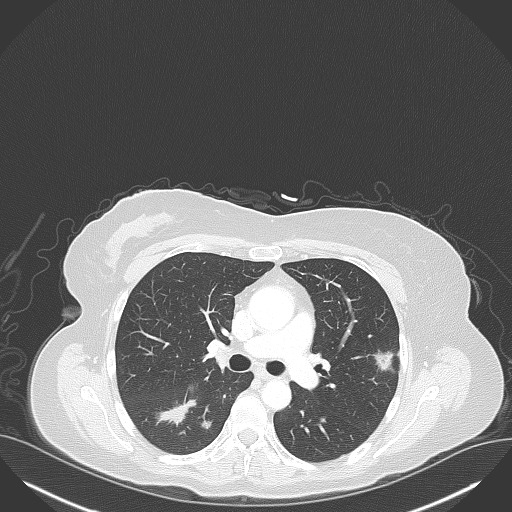
[im 105/147  lung]
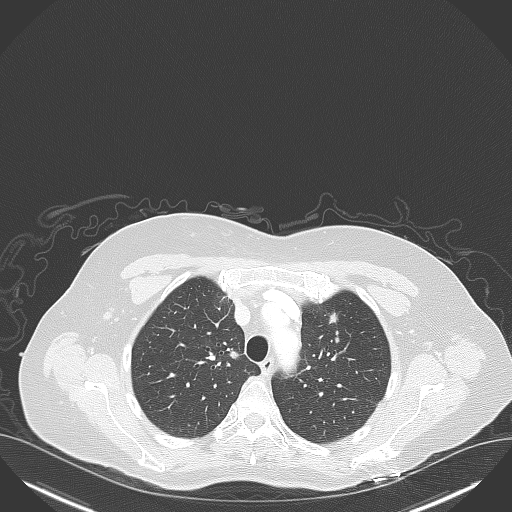
[im 126/147  lung]
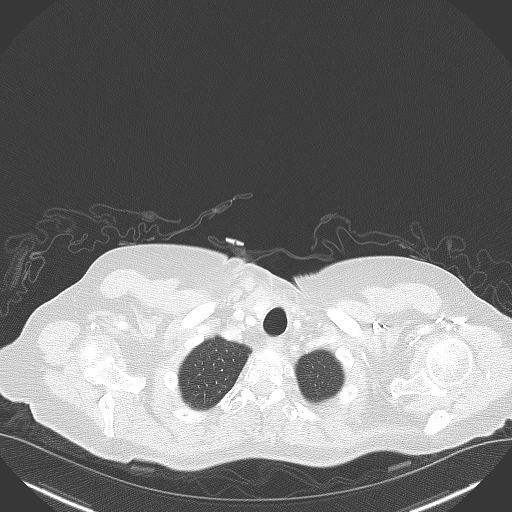

[Series 6: coronal · coronal · 0.61mm/px · 1 of 132 slices shown]
[im 66/132  lung]
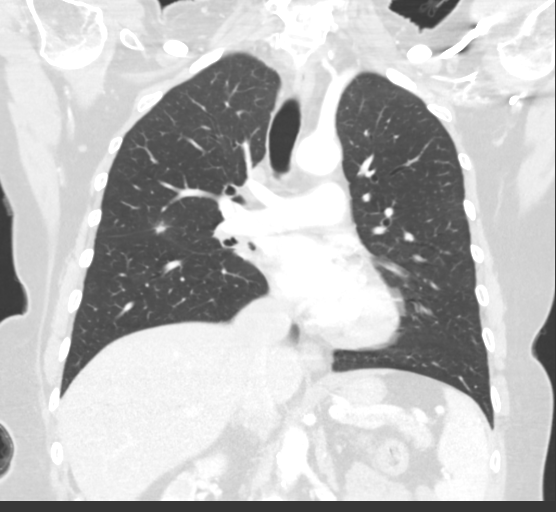

[Series 8: axial st · axial · 0.78mm/px · z∈[+1451,+1551]mm · 2 of 61 slices shown, 3 images]
[im 21/61  mediastinal]
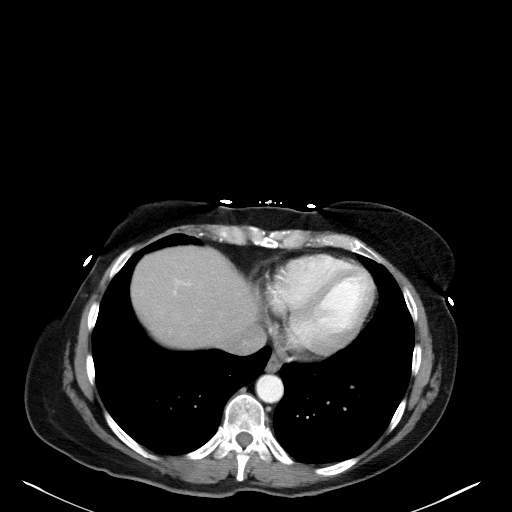
[im 21/61  lung]
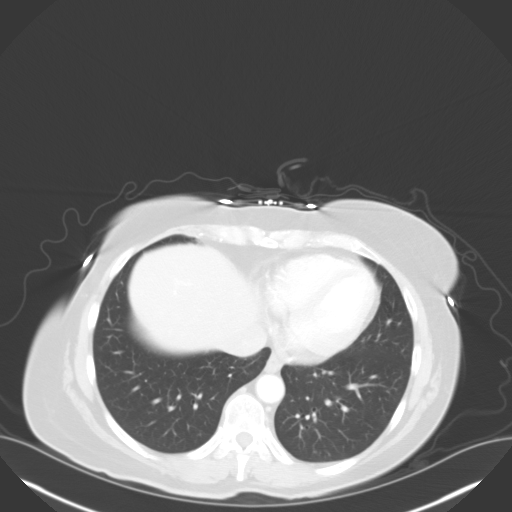
[im 41/61  lung]
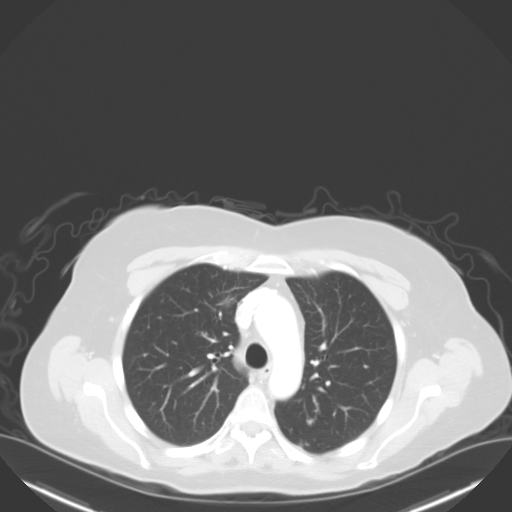

[Series 9: axial neck · axial · 0.52mm/px · z∈[+1597,+1685]mm · 3 of 112 slices shown]
[im 23/112  lung]
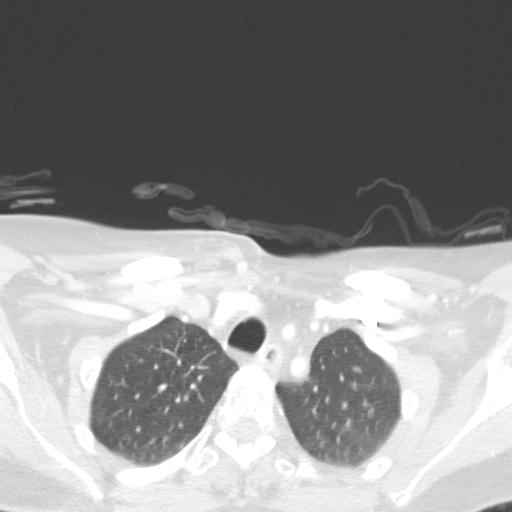
[im 45/112  lung]
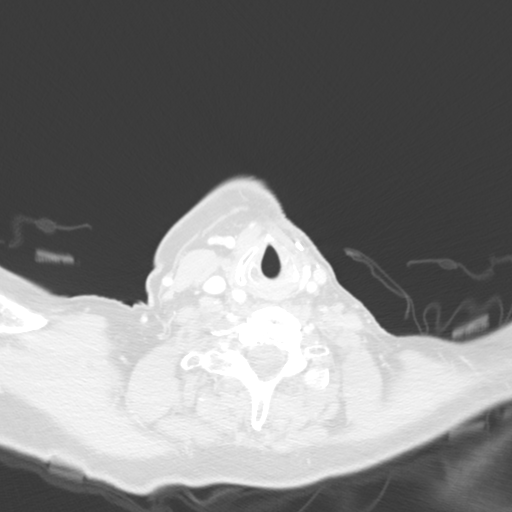
[im 67/112  lung]
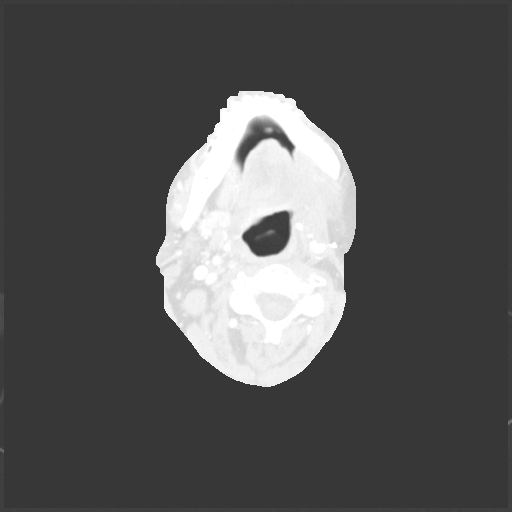

[Series 13: orthogonal ax · axial · 0.39mm/px · z∈[+1587,+1737]mm · 5 of 113 slices shown]
[im 19/113  lung]
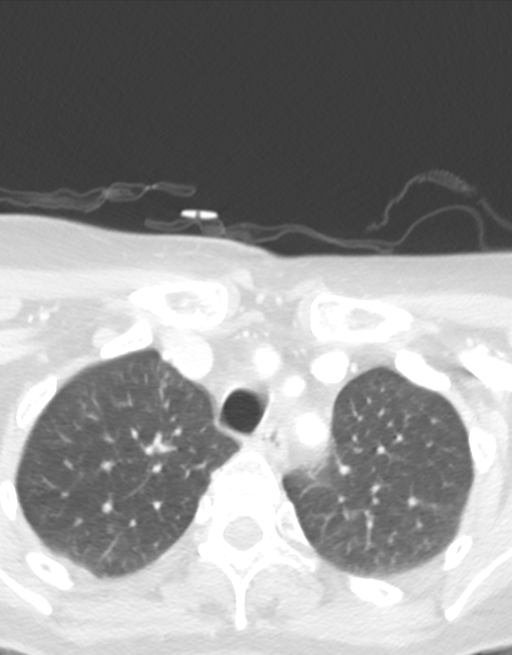
[im 38/113  lung]
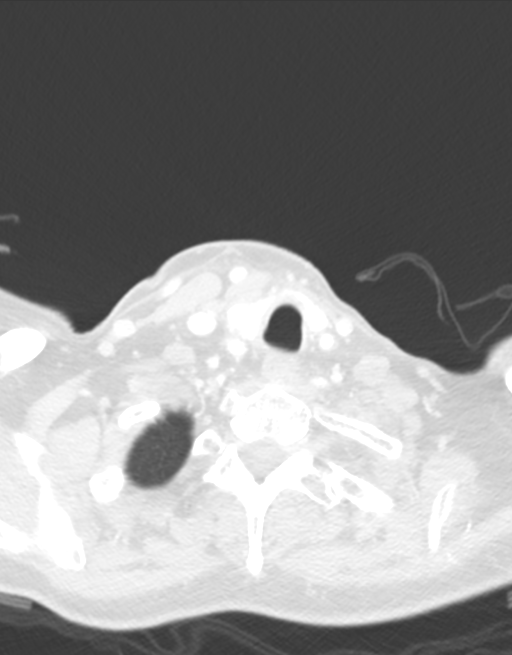
[im 57/113  lung]
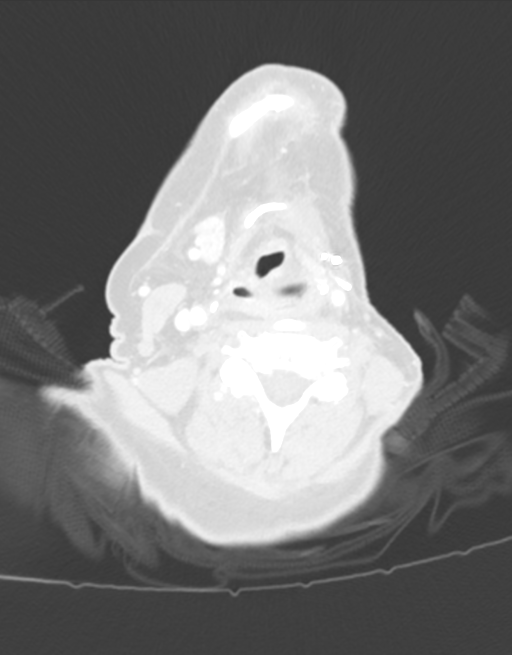
[im 75/113  lung]
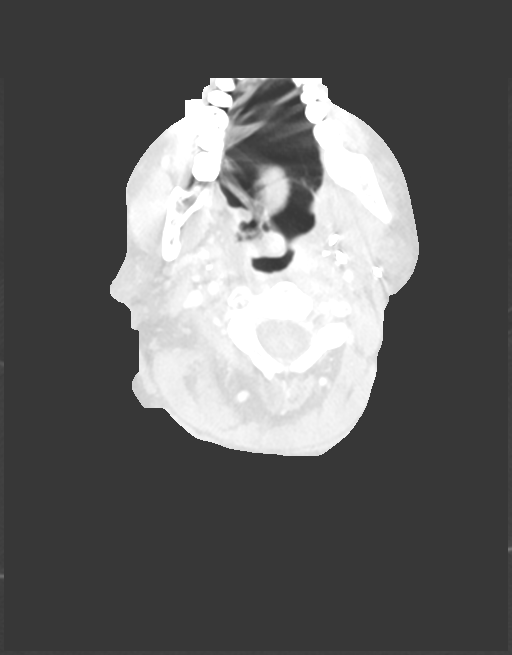
[im 94/113  lung]
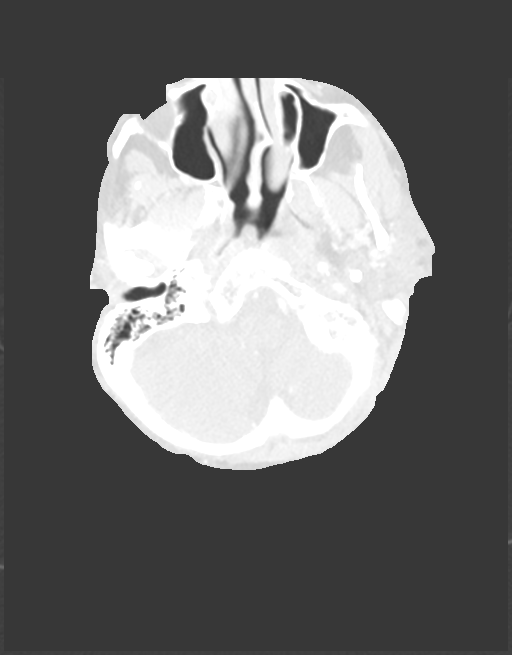

[17 of 36 positions shown; findings below may reference images not displayed]

FINDINGS: Cardiovascular: The heart size is within normal limits. There is no
pericardial effusion identified. Calcification within the RCA and
LAD coronary artery noted.

Mediastinum/Nodes: The trachea appears patent and is midline. Normal
appearance of the esophagus. No enlarged mediastinal or hilar lymph
nodes. No axillary or supraclavicular adenopathy.

Lungs/Pleura: Multifocal pulmonary nodules are again noted within
both lungs. Index nodule in the left upper lobe Measures 8 mm, image
45 of series 5. Previously 5 mm. New lesion within the right upper
lobe measures 1.1 cm, image 47 of series 5. Left upper lobe index
nodule measures 1.2 cm, image 64 of series 5. Previously 0.5 cm.
Index nodule within the right lower lobe measures 2.9 cm, image 64
of series 5. Previously 5 mm. Within the left lower lobe there is a
pulmonary nodule measuring 1.5 cm, image 84 of series 5. Previously
0.4 cm.

Upper Abdomen: No acute abnormality.

Musculoskeletal: No aggressive lytic or sclerotic bone lesions
identified.
IMPRESSION: 1. Interval progression of multifocal pulmonary metastases.
2. Aortic Atherosclerosis ([id number]). Multi vessel coronary
artery calcifications noted.

## 2017-11-23 DEATH — deceased

## 2018-10-05 NOTE — Therapy (Signed)
[facility] [address] [place], [place], [address] Phone: [phone]   Fax:  [phone]  Patient Details  Name: Sabrina Romero MRN: [id number] Date of Birth: [birth year] Referring Provider:  Eppie Gibson MD  Encounter Date: 10/05/2018   SPEECH THERAPY DISCHARGE SUMMARY  Visits from Start of Care: 1 (eval)  Current functional level related to goals / functional outcomes: Pt was not seen for follow up ST following initial evaluation. Pt had been placed on PEG. Goals/impression from evaluation were as follows:  SLP Short Term Goals - 05/04/17 1206              SLP SHORT TERM GOAL #1    Title pt will complete HEP with occasional min A    Time 1    Period --  visti    Status New         SLP SHORT TERM GOAL #2    Title pt will tell SLP why she is completing HEP with min A    Time 1    Period --  visit    Status New         SLP SHORT TERM GOAL #3    Title pt will tell SLP guidelines of water protocol with mod A (yes/no questions)    Time 1    Period --  visit    Status New         SLP SHORT TERM GOAL #4    Title pt will tell SLP two overt s/s of aspiration PNA with modified independence    Time 1    Period --  visits    Status New                       SLP Long Term Goals - 05/04/17 1209              SLP LONG TERM GOAL #1    Title pt will complete HEP with rare min A over two sessions     Time 2    Period --  visits    Status New         SLP LONG TERM GOAL #2    Title pt will tell SLP why a food journal is helpful in returning to most liberal diet    Time 2    Period --  visits    Status New                       Plan - 05/04/17 1202     Clinical Impression Statement Pt with disordered oropharyngeal swallowing, so probability of swallowing difficulty is increased dramatically with the initiation of radiation therapy. Pt will need to be followed closely by RN/MD for s/s  aspiration PNA. Pt would benefit from skilled ST for regular assessment of accurate HEP completion as well as for safety with POs both during and following treatment/s. Education was provided today re: procedure for HEP, water protocol, as well as for late effects head/neck radiation on swallow ability.     Remaining deficits: Unknown as pt was not seen after 05-04-17.   Education / Equipment: HEP procedure, water protocol, late effects head/neck CA on swallowing. Plan: Patient agrees to discharge.  Patient goals were not met. Patient is being discharged due to not returning since the last visit.  ?????       SCHINKE,CARL ,[place], CCC-SLP  10/05/2018, 9:54 AM  [facility] [address] [place], [place], [address] Phone: [phone]   Fax:  [phone]
